# Patient Record
Sex: Female | Born: 1980 | Race: White | Hispanic: No | Marital: Single | Smoking: Current some day smoker
Health system: Southern US, Community
[De-identification: ages and names within clinical notes are randomized; demographics above are authoritative.]

## PROBLEM LIST (undated history)

## (undated) DIAGNOSIS — I219 Acute myocardial infarction, unspecified: Secondary | ICD-10-CM

## (undated) DIAGNOSIS — R1031 Right lower quadrant pain: Secondary | ICD-10-CM

## (undated) DIAGNOSIS — G8929 Other chronic pain: Secondary | ICD-10-CM

## (undated) HISTORY — PX: CHOLECYSTECTOMY: SHX55

---

## 2009-07-02 ENCOUNTER — Emergency Department (HOSPITAL_BASED_OUTPATIENT_CLINIC_OR_DEPARTMENT_OTHER): Admission: EM | Admit: 2009-07-02 | Discharge: 2009-07-02 | Payer: Self-pay | Admitting: Emergency Medicine

## 2009-07-02 ENCOUNTER — Ambulatory Visit: Payer: Self-pay | Admitting: Diagnostic Radiology

## 2009-08-18 ENCOUNTER — Ambulatory Visit: Payer: Self-pay | Admitting: Diagnostic Radiology

## 2009-08-18 ENCOUNTER — Emergency Department (HOSPITAL_BASED_OUTPATIENT_CLINIC_OR_DEPARTMENT_OTHER): Admission: EM | Admit: 2009-08-18 | Discharge: 2009-08-18 | Payer: Self-pay | Admitting: Emergency Medicine

## 2009-09-06 ENCOUNTER — Ambulatory Visit: Payer: Self-pay | Admitting: Diagnostic Radiology

## 2009-09-06 ENCOUNTER — Emergency Department (HOSPITAL_BASED_OUTPATIENT_CLINIC_OR_DEPARTMENT_OTHER): Admission: EM | Admit: 2009-09-06 | Discharge: 2009-09-06 | Payer: Self-pay | Admitting: Emergency Medicine

## 2009-11-02 ENCOUNTER — Ambulatory Visit: Payer: Self-pay | Admitting: Diagnostic Radiology

## 2009-11-02 ENCOUNTER — Emergency Department (HOSPITAL_BASED_OUTPATIENT_CLINIC_OR_DEPARTMENT_OTHER): Admission: EM | Admit: 2009-11-02 | Discharge: 2009-11-02 | Payer: Self-pay | Admitting: Emergency Medicine

## 2009-11-30 ENCOUNTER — Emergency Department (HOSPITAL_BASED_OUTPATIENT_CLINIC_OR_DEPARTMENT_OTHER): Admission: EM | Admit: 2009-11-30 | Discharge: 2009-12-01 | Payer: Self-pay | Admitting: Emergency Medicine

## 2009-11-30 ENCOUNTER — Ambulatory Visit: Payer: Self-pay | Admitting: Diagnostic Radiology

## 2009-12-10 ENCOUNTER — Emergency Department (HOSPITAL_BASED_OUTPATIENT_CLINIC_OR_DEPARTMENT_OTHER): Admission: EM | Admit: 2009-12-10 | Discharge: 2009-12-10 | Payer: Self-pay | Admitting: Emergency Medicine

## 2009-12-10 ENCOUNTER — Ambulatory Visit: Payer: Self-pay | Admitting: Diagnostic Radiology

## 2010-02-23 ENCOUNTER — Ambulatory Visit: Payer: Self-pay | Admitting: Radiology

## 2010-02-23 ENCOUNTER — Emergency Department (HOSPITAL_BASED_OUTPATIENT_CLINIC_OR_DEPARTMENT_OTHER): Admission: EM | Admit: 2010-02-23 | Discharge: 2010-02-23 | Payer: Self-pay | Admitting: Emergency Medicine

## 2010-06-06 ENCOUNTER — Emergency Department (HOSPITAL_BASED_OUTPATIENT_CLINIC_OR_DEPARTMENT_OTHER): Admission: EM | Admit: 2010-06-06 | Discharge: 2010-06-07 | Payer: Self-pay | Admitting: Emergency Medicine

## 2010-06-06 ENCOUNTER — Ambulatory Visit: Payer: Self-pay | Admitting: Diagnostic Radiology

## 2010-07-24 IMAGING — CT CT ANGIO CHEST
2 of 6 series · 18 of 36 positions shown · IV contrast (APPLIED)
Comparison: None

CLINICAL DATA: Chest pain, back pain

CT ANGIOGRAPHY CHEST WITH CONTRAST
TECHNIQUE: Multidetector CT imaging of the chest was performed
using the standard protocol during bolus administration of
intravenous contrast. Multiplanar CT image reconstructions
including MIPs were obtained to evaluate the vascular anatomy.
Contrast: 80 ml of Rmnipaque-ACC

[Series 10: pe 1.0 b25f · axial · 0.63mm/px · z∈[-270,-63]mm · 17 of 231 slices shown]
[im 12/231  lung]
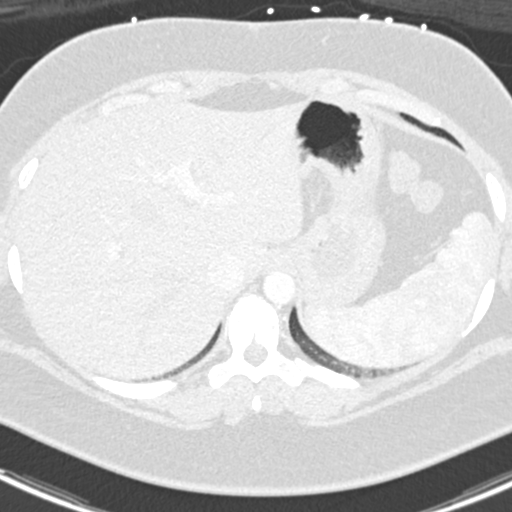
[im 24/231  mediastinal]
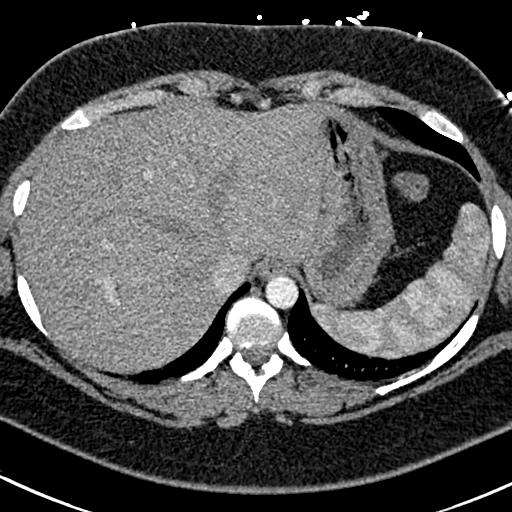
[im 35/231  lung]
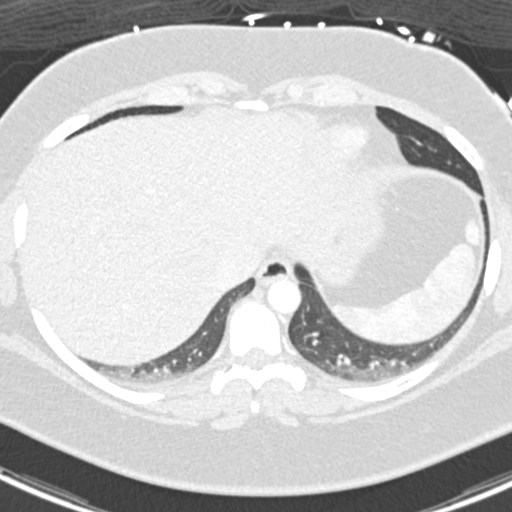
[im 47/231  mediastinal]
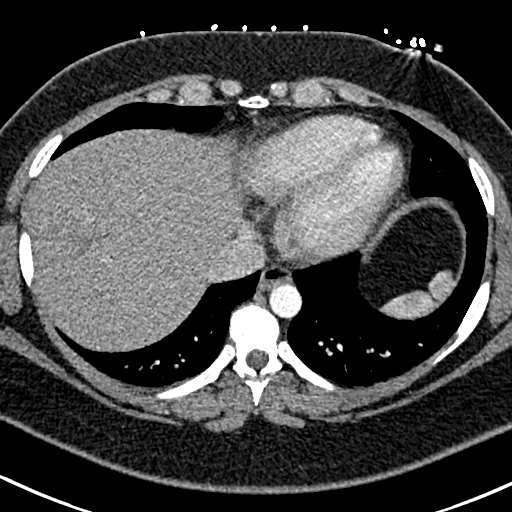
[im 70/231  lung]
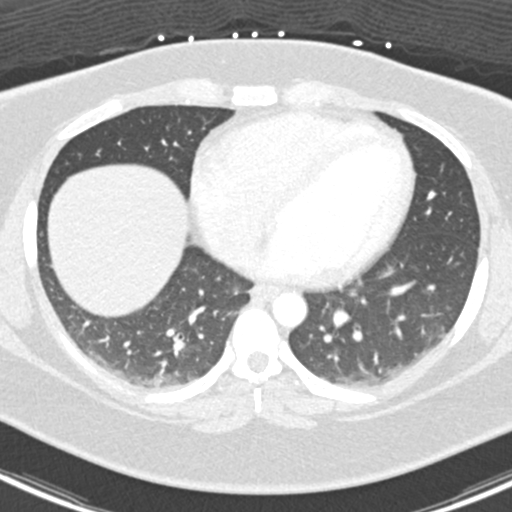
[im 81/231  mediastinal]
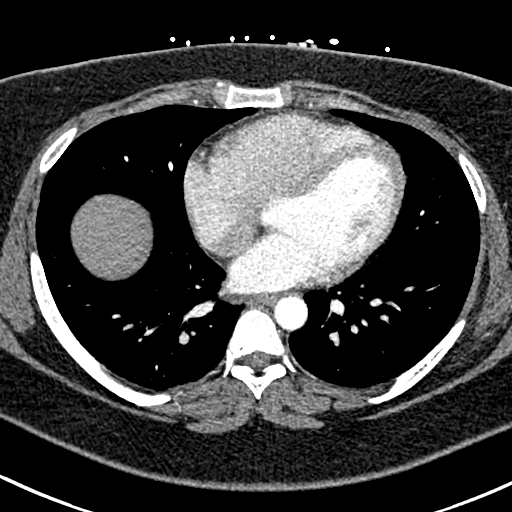
[im 93/231  lung]
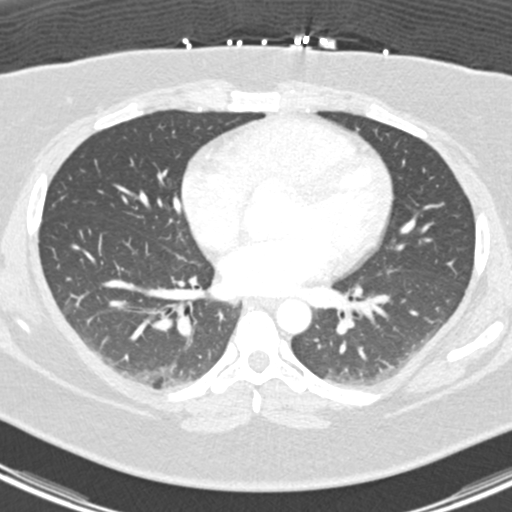
[im 104/231  mediastinal]
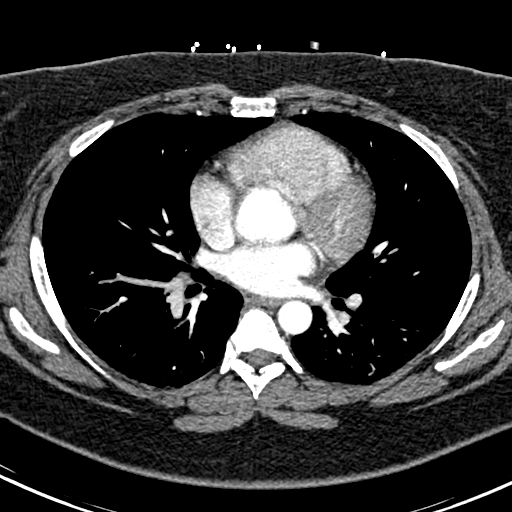
[im 116/231  lung]
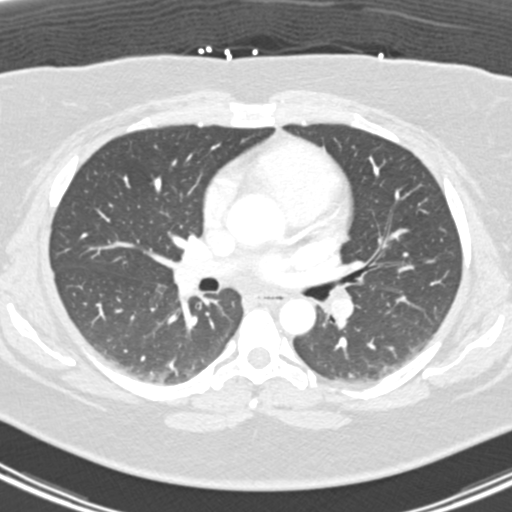
[im 127/231  mediastinal]
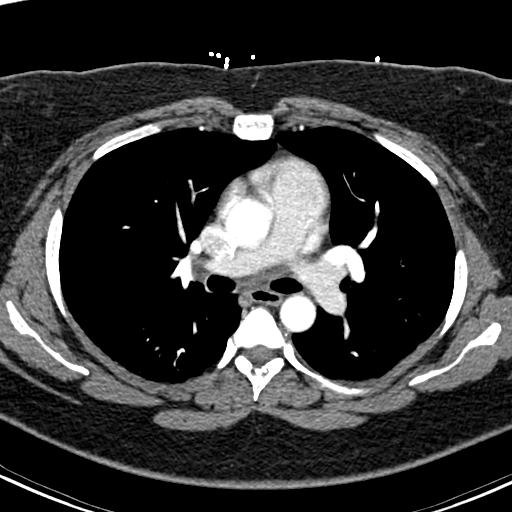
[im 139/231  lung]
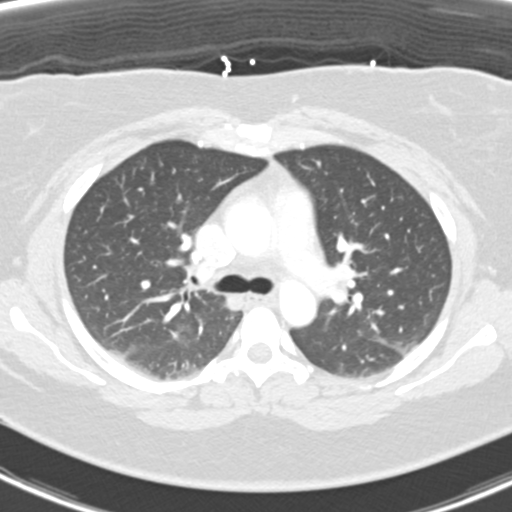
[im 150/231  mediastinal]
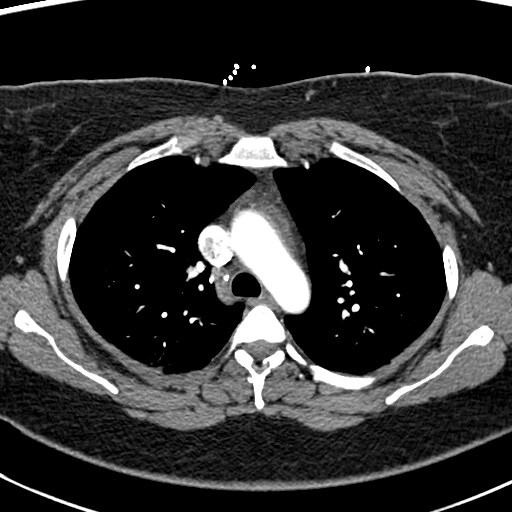
[im 162/231  lung]
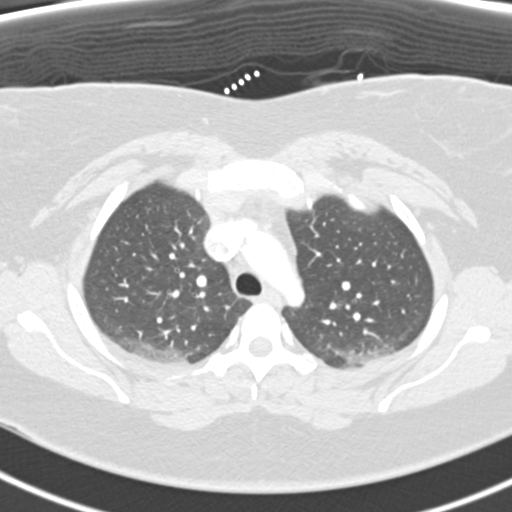
[im 185/231  mediastinal]
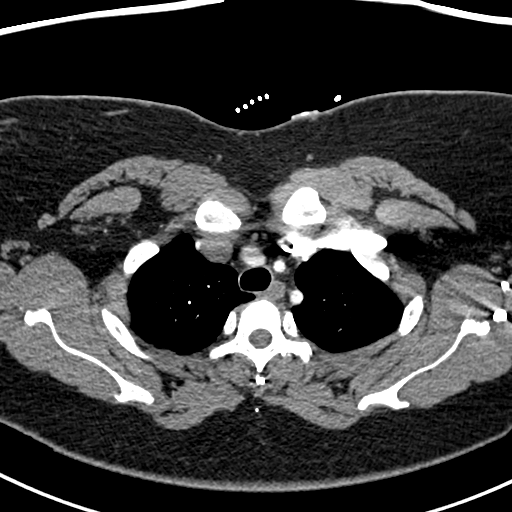
[im 196/231  lung]
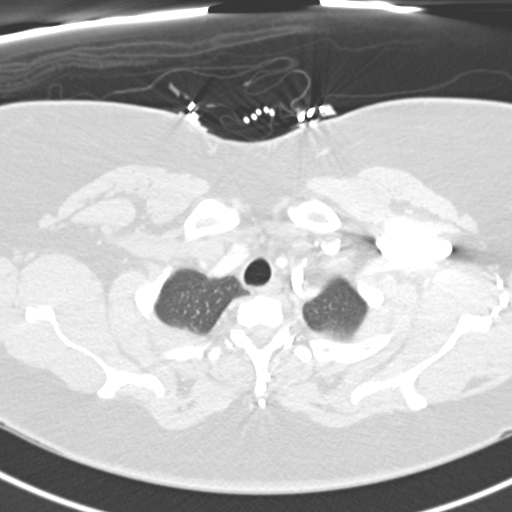
[im 208/231  mediastinal]
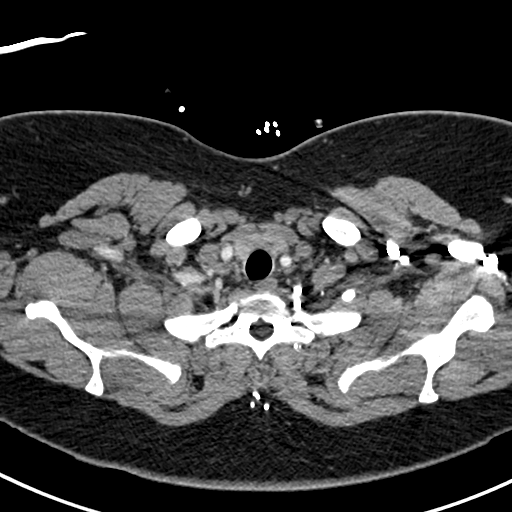
[im 219/231  lung]
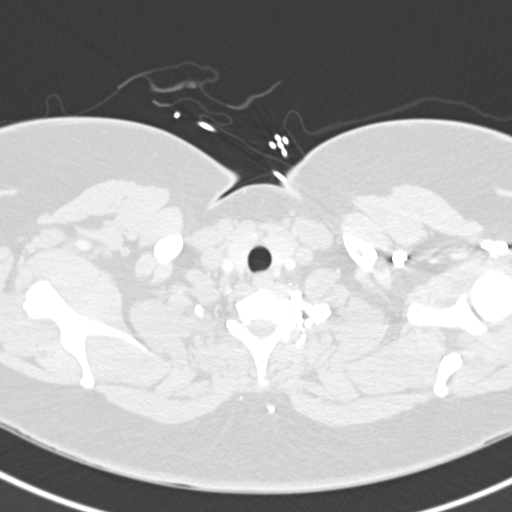

[Series 13: pe 2.0 coronal · coronal · 0.38mm/px · 1 of 115 slices shown]
[im 58/115  mediastinal]
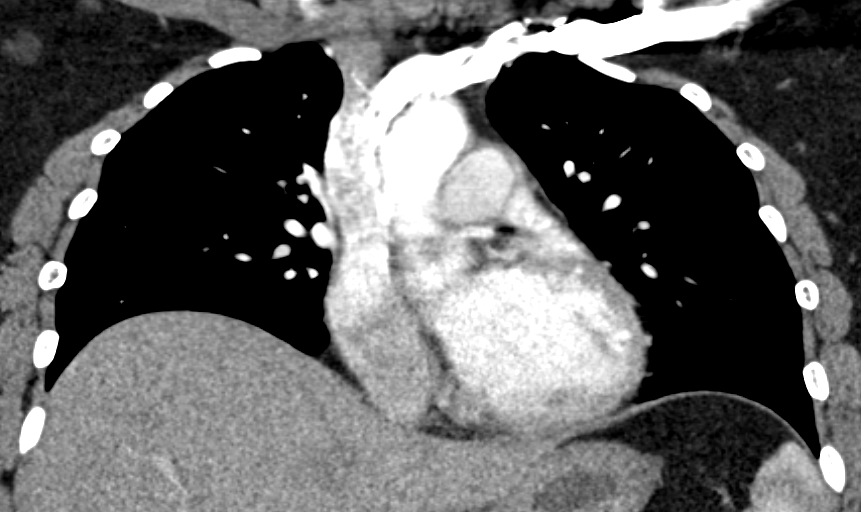

[18 of 36 positions shown; findings below may reference images not displayed]

FINDINGS: Images of the thoracic inlet are unremarkable.  Central
airways are patent.  No aortic aneurysm.  Heart size is within
normal limits.  No pericardial effusion.  No adenopathy.  The study
is limited due to poor bolus contrast opacification of the
pulmonary artery.  No gross central pulmonary embolus is noted.
Exam is limited for evaluation of the lobar branches.  The exam is
not diagnostic for evaluation of segmental arterial branches.

Images of the lung parenchyma shows no acute infiltrate or pleural
effusion.  No pulmonary edema.

No destructive bony lesions are noted.

The visualized upper abdomen is unremarkable.

Review of the MIP images confirms the above findings.
IMPRESSION: 1.  Limited exam as described above.  No gross central pulmonary
embolus is noted.  The exam is nondiagnostic for evaluation of the
segmental arterial branches.  If there is high clinical suspicion
for pulmonary embolus further evaluation with VQ scan could be
performed or a repeat CT of the chest in the 24 hours.
2.  No acute infiltrate or edema.

## 2010-10-27 IMAGING — CR DG CHEST 2V
2 series · 2 of 2 positions shown · non-contrast
Comparison: 11/30/2009

CLINICAL DATA: Chest pain radiates to the left side of back.

CHEST - 2 VIEW

[w chest pa]
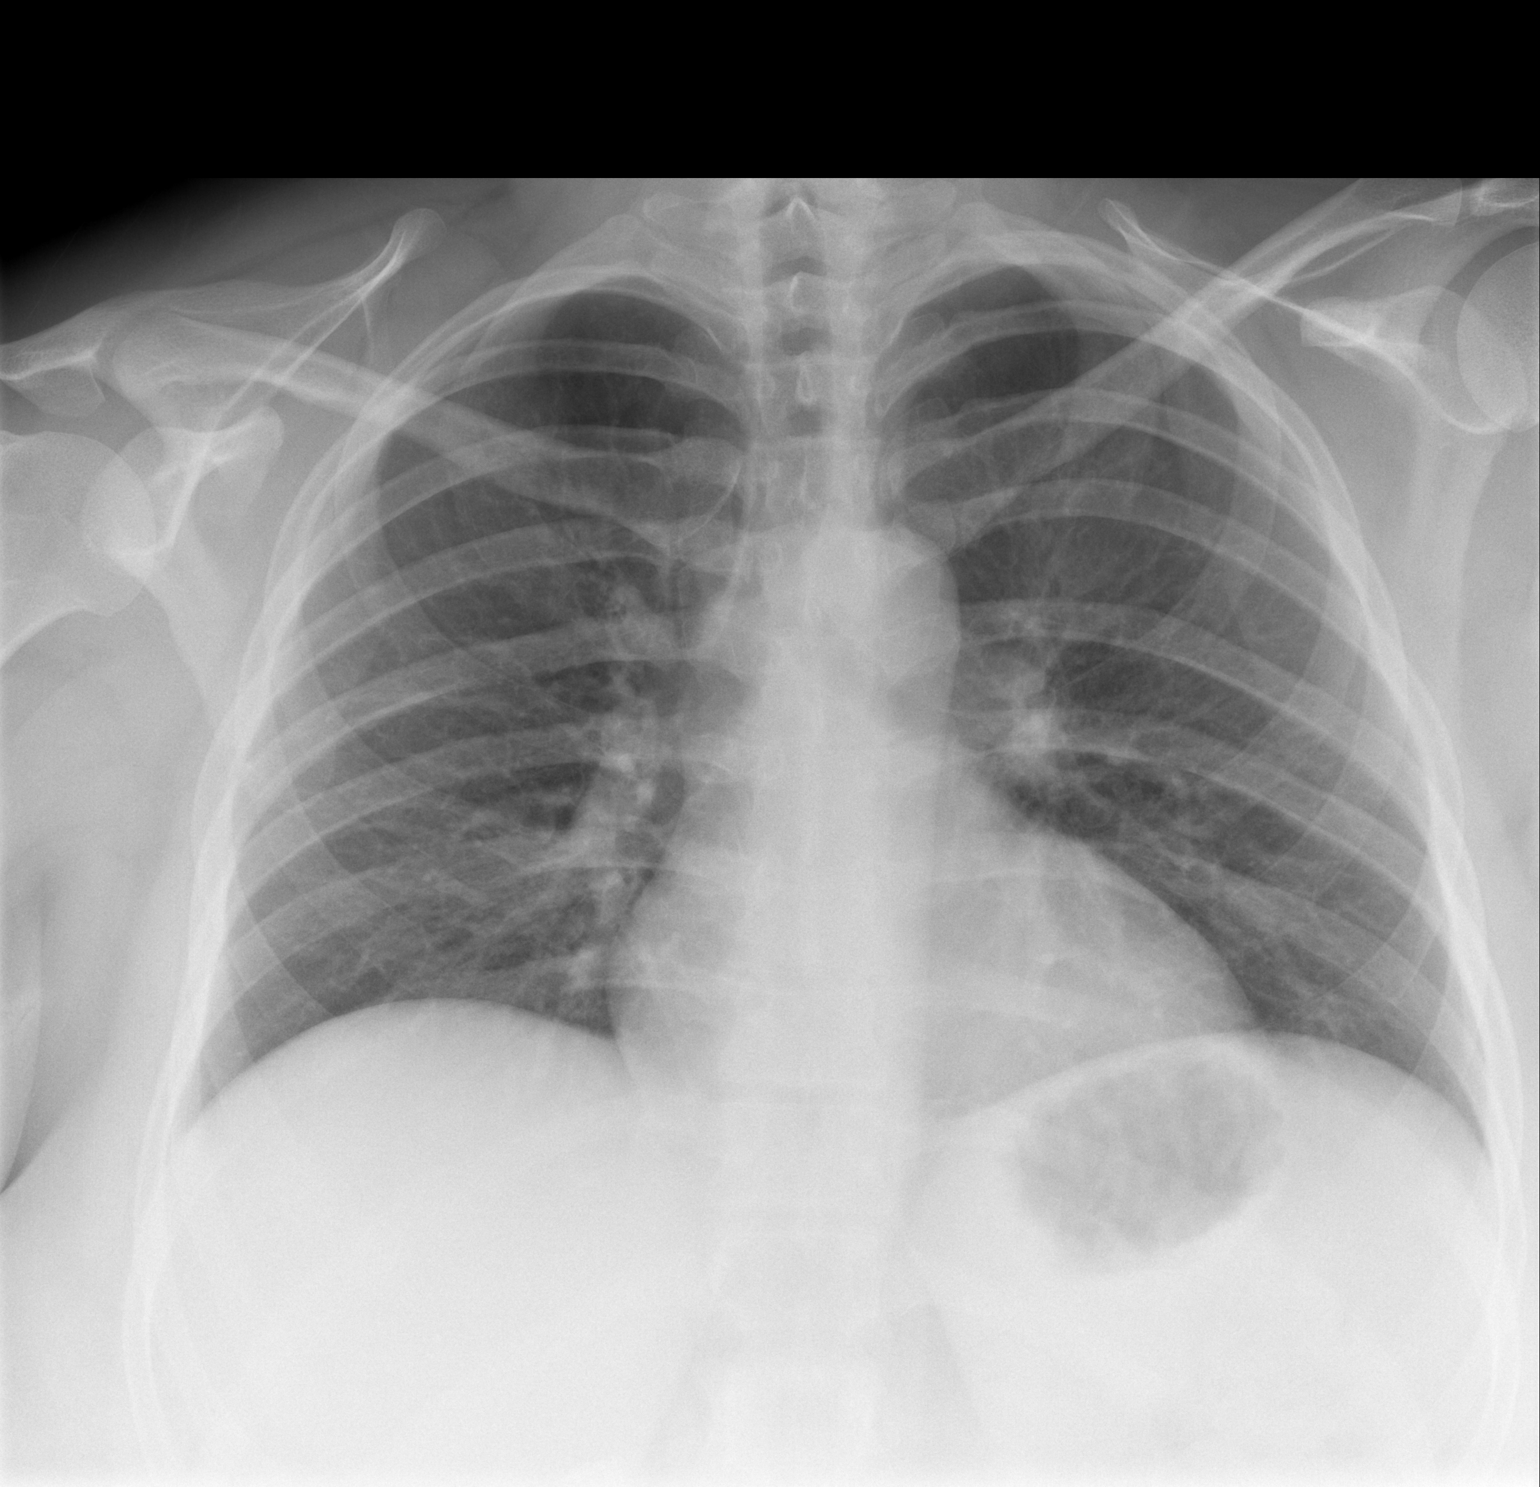

[w chest lat]
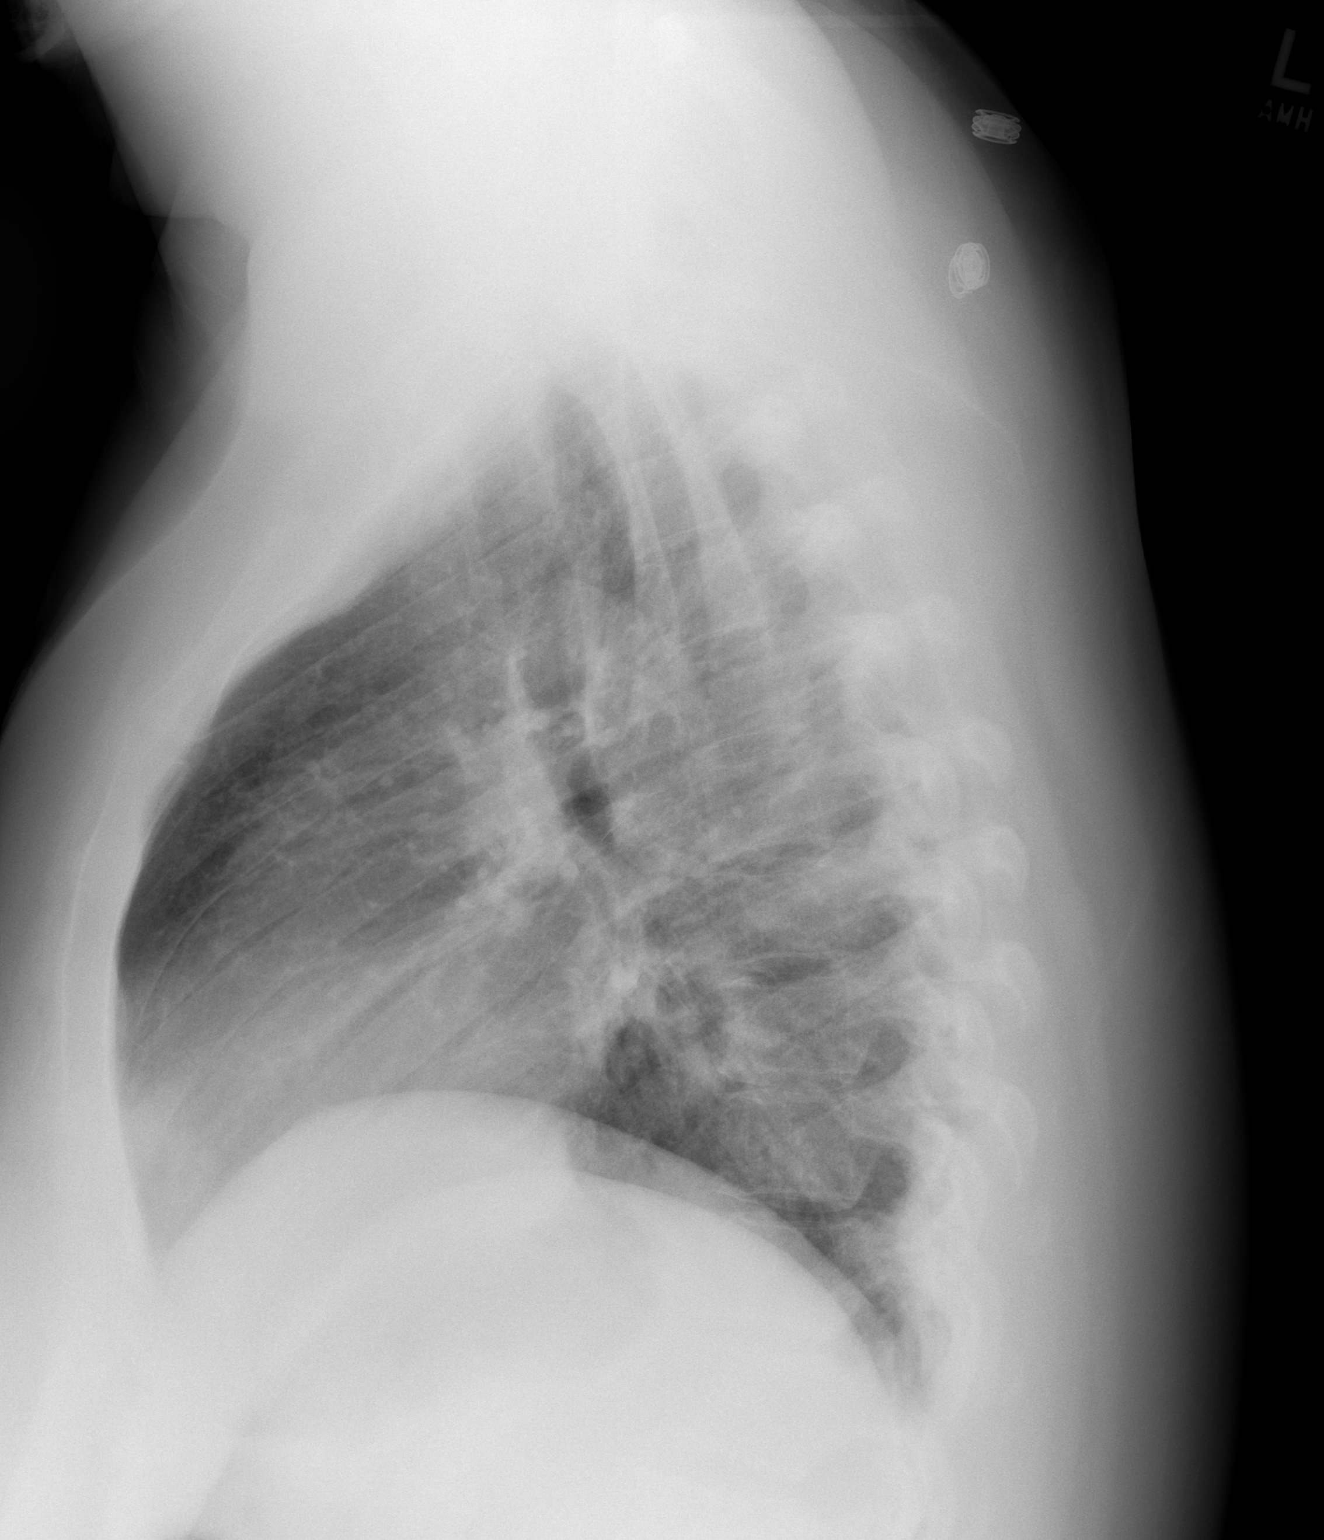

[2 of 2 positions shown; findings below may reference images not displayed]

FINDINGS: Lung volumes are low. The lungs are clear without focal
infiltrate, edema, pneumothorax or pleural effusion. The
cardiopericardial silhouette is within normal limits for size.
Imaged bony structures of the thorax are intact.
IMPRESSION: Low lung volumes.  Otherwise normal exam.

## 2010-11-09 ENCOUNTER — Emergency Department (HOSPITAL_BASED_OUTPATIENT_CLINIC_OR_DEPARTMENT_OTHER)
Admission: EM | Admit: 2010-11-09 | Discharge: 2010-11-09 | Payer: Self-pay | Source: Home / Self Care | Admitting: Emergency Medicine

## 2010-11-14 LAB — CBC
Hemoglobin: 13.8 g/dL (ref 12.0–15.0)
MCH: 30.8 pg (ref 26.0–34.0)
MCHC: 33.9 g/dL (ref 30.0–36.0)
Platelets: 239 10*3/uL (ref 150–400)
RDW: 13.2 % (ref 11.5–15.5)

## 2010-11-14 LAB — URINALYSIS, ROUTINE W REFLEX MICROSCOPIC
Ketones, ur: NEGATIVE mg/dL
Specific Gravity, Urine: 1.024 (ref 1.005–1.030)

## 2010-11-14 LAB — COMPREHENSIVE METABOLIC PANEL
ALT: 27 U/L (ref 0–35)
Albumin: 4.1 g/dL (ref 3.5–5.2)
Calcium: 9.2 mg/dL (ref 8.4–10.5)
GFR calc Af Amer: >60 mL/min (ref >60)
Glucose, Bld: 97 mg/dL (ref 70–99)
Potassium: 4.1 mEq/L (ref 3.5–5.1)
Sodium: 144 mEq/L (ref 135–145)
Total Protein: 7.3 g/dL (ref 6.0–8.3)

## 2010-11-14 LAB — POCT CARDIAC MARKERS
CKMB, poc: <1 ng/mL — ABNORMAL LOW (ref 1.0–8.0)
Myoglobin, poc: 46.9 ng/mL (ref 12–200)
Myoglobin, poc: 44.1 ng/mL (ref 12–200)
Troponin i, poc: <0.05 ng/mL (ref 0.00–0.09)

## 2010-11-14 LAB — DIFFERENTIAL
Basophils Absolute: 0 10*3/uL (ref 0.0–0.1)
Basophils Relative: 0 % (ref 0–1)
Eosinophils Absolute: 0.2 10*3/uL (ref 0.0–0.7)
Eosinophils Relative: 2 % (ref 0–5)
Lymphocytes Relative: 31 % (ref 12–46)

## 2010-11-14 LAB — LIPASE, BLOOD: Lipase: 66 U/L (ref 23–300)

## 2010-11-14 LAB — URINE CULTURE: Colony Count: 70000

## 2010-11-14 LAB — URINE MICROSCOPIC-ADD ON

## 2010-12-08 ENCOUNTER — Emergency Department (HOSPITAL_BASED_OUTPATIENT_CLINIC_OR_DEPARTMENT_OTHER)
Admission: EM | Admit: 2010-12-08 | Discharge: 2010-12-08 | Disposition: A | Discharge status: Home / Self Care | Payer: Self-pay | Attending: Emergency Medicine | Admitting: Emergency Medicine

## 2010-12-08 DIAGNOSIS — R21 Rash and other nonspecific skin eruption: Secondary | ICD-10-CM | POA: Insufficient documentation

## 2010-12-08 DIAGNOSIS — M79609 Pain in unspecified limb: Secondary | ICD-10-CM | POA: Insufficient documentation

## 2011-01-06 LAB — URINALYSIS, ROUTINE W REFLEX MICROSCOPIC
Bilirubin Urine: NEGATIVE
Hgb urine dipstick: NEGATIVE
Protein, ur: NEGATIVE mg/dL
Specific Gravity, Urine: 1.025 (ref 1.005–1.030)
Urobilinogen, UA: 0.2 mg/dL (ref 0.0–1.0)

## 2011-01-06 LAB — URINE MICROSCOPIC-ADD ON

## 2011-01-10 LAB — URINALYSIS, ROUTINE W REFLEX MICROSCOPIC
Ketones, ur: NEGATIVE mg/dL
Nitrite: NEGATIVE
Specific Gravity, Urine: 1.022 (ref 1.005–1.030)
Urobilinogen, UA: 0.2 mg/dL (ref 0.0–1.0)
pH: 5.5 (ref 5.0–8.0)

## 2011-01-10 LAB — PREGNANCY, URINE: Preg Test, Ur: NEGATIVE

## 2011-01-10 LAB — URINE MICROSCOPIC-ADD ON

## 2011-01-11 ENCOUNTER — Emergency Department (HOSPITAL_BASED_OUTPATIENT_CLINIC_OR_DEPARTMENT_OTHER)
Admission: EM | Admit: 2011-01-11 | Discharge: 2011-01-12 | Disposition: A | Discharge status: Home / Self Care | Payer: Self-pay | Attending: Emergency Medicine | Admitting: Emergency Medicine

## 2011-01-11 DIAGNOSIS — J029 Acute pharyngitis, unspecified: Secondary | ICD-10-CM | POA: Insufficient documentation

## 2011-01-11 DIAGNOSIS — J069 Acute upper respiratory infection, unspecified: Secondary | ICD-10-CM | POA: Insufficient documentation

## 2011-01-11 DIAGNOSIS — R0789 Other chest pain: Secondary | ICD-10-CM | POA: Insufficient documentation

## 2011-01-11 DIAGNOSIS — R071 Chest pain on breathing: Secondary | ICD-10-CM | POA: Insufficient documentation

## 2011-01-11 LAB — URINALYSIS, ROUTINE W REFLEX MICROSCOPIC
Bilirubin Urine: NEGATIVE
Glucose, UA: NEGATIVE mg/dL
Hgb urine dipstick: NEGATIVE
Ketones, ur: NEGATIVE mg/dL
Nitrite: NEGATIVE
Protein, ur: NEGATIVE mg/dL
Specific Gravity, Urine: 1.018 (ref 1.005–1.030)
Urobilinogen, UA: 0.2 mg/dL (ref 0.0–1.0)
pH: 7.5 (ref 5.0–8.0)

## 2011-01-11 LAB — POCT CARDIAC MARKERS
CKMB, poc: <1 ng/mL — ABNORMAL LOW (ref 1.0–8.0)
Myoglobin, poc: 56 ng/mL (ref 12–200)

## 2011-01-11 LAB — CBC
HCT: 41.3 % (ref 36.0–46.0)
Hemoglobin: 14.1 g/dL (ref 12.0–15.0)
Hemoglobin: 14.1 g/dL (ref 12.0–15.0)
MCHC: 34.2 g/dL (ref 30.0–36.0)
MCV: 94.5 fL (ref 78.0–100.0)
MCV: 95 fL (ref 78.0–100.0)
Platelets: 205 10*3/uL (ref 150–400)
RBC: 4.35 MIL/uL (ref 3.87–5.11)
RBC: 4.4 MIL/uL (ref 3.87–5.11)
RDW: 12.8 % (ref 11.5–15.5)
WBC: 12.8 10*3/uL — ABNORMAL HIGH (ref 4.0–10.5)
WBC: 9.3 10*3/uL (ref 4.0–10.5)

## 2011-01-11 LAB — DIFFERENTIAL
Basophils Absolute: 0.1 10*3/uL (ref 0.0–0.1)
Basophils Relative: 1 % (ref 0–1)
Eosinophils Absolute: 0.5 10*3/uL (ref 0.0–0.7)
Eosinophils Relative: 4 % (ref 0–5)
Lymphocytes Relative: 20 % (ref 12–46)
Lymphs Abs: 2.6 10*3/uL (ref 0.7–4.0)
Lymphs Abs: 2.6 K/uL (ref 0.7–4.0)
Monocytes Absolute: 0.4 10*3/uL (ref 0.1–1.0)
Monocytes Absolute: 0.7 10*3/uL (ref 0.1–1.0)
Monocytes Relative: 4 % (ref 3–12)
Monocytes Relative: 5 % (ref 3–12)
Neutro Abs: 6.2 10*3/uL (ref 1.7–7.7)
Neutro Abs: 8.9 K/uL — ABNORMAL HIGH (ref 1.7–7.7)
Neutrophils Relative %: 66 % (ref 43–77)
Neutrophils Relative %: 70 % (ref 43–77)

## 2011-01-11 LAB — LIPASE, BLOOD: Lipase: 97 U/L (ref 23–300)

## 2011-01-11 LAB — URINE MICROSCOPIC-ADD ON

## 2011-01-11 LAB — URINE CULTURE: Colony Count: 4000

## 2011-01-11 LAB — COMPREHENSIVE METABOLIC PANEL
ALT: 12 U/L (ref 0–35)
AST: 16 U/L (ref 0–37)
Albumin: 4.3 g/dL (ref 3.5–5.2)
Alkaline Phosphatase: 81 U/L (ref 39–117)
CO2: 29 mEq/L (ref 19–32)
Chloride: 105 mEq/L (ref 96–112)
GFR calc Af Amer: >60 mL/min (ref >60)
GFR calc non Af Amer: >60 mL/min (ref >60)
Potassium: 4.2 mEq/L (ref 3.5–5.1)
Sodium: 143 mEq/L (ref 135–145)
Total Bilirubin: 0.3 mg/dL (ref 0.3–1.2)

## 2011-01-11 LAB — BASIC METABOLIC PANEL
Calcium: 9.1 mg/dL (ref 8.4–10.5)
Chloride: 107 mEq/L (ref 96–112)
Creatinine, Ser: 0.9 mg/dL (ref 0.4–1.2)
GFR calc Af Amer: >60 mL/min (ref >60)
Sodium: 143 mEq/L (ref 135–145)

## 2011-01-11 LAB — COMPREHENSIVE METABOLIC PANEL WITH GFR
BUN: 12 mg/dL (ref 6–23)
Calcium: 9.3 mg/dL (ref 8.4–10.5)
Creatinine, Ser: 0.8 mg/dL (ref 0.4–1.2)
Glucose, Bld: 91 mg/dL (ref 70–99)
Total Protein: 7.6 g/dL (ref 6.0–8.3)

## 2011-01-11 LAB — PREGNANCY, URINE: Preg Test, Ur: NEGATIVE

## 2011-01-25 LAB — COMPREHENSIVE METABOLIC PANEL
ALT: 20 U/L (ref 0–35)
AST: 19 U/L (ref 0–37)
Alkaline Phosphatase: 83 U/L (ref 39–117)
CO2: 26 mEq/L (ref 19–32)
Chloride: 107 mEq/L (ref 96–112)
GFR calc Af Amer: >60 mL/min (ref >60)
GFR calc non Af Amer: >60 mL/min (ref >60)
Potassium: 4 mEq/L (ref 3.5–5.1)
Sodium: 143 mEq/L (ref 135–145)
Total Bilirubin: 0.4 mg/dL (ref 0.3–1.2)

## 2011-01-25 LAB — DIFFERENTIAL
Basophils Absolute: 0 10*3/uL (ref 0.0–0.1)
Basophils Relative: 0 % (ref 0–1)
Eosinophils Absolute: 0.2 10*3/uL (ref 0.0–0.7)
Eosinophils Relative: 3 % (ref 0–5)
Lymphs Abs: 1.9 10*3/uL (ref 0.7–4.0)

## 2011-01-25 LAB — CBC
RBC: 4.3 MIL/uL (ref 3.87–5.11)
WBC: 7.1 10*3/uL (ref 4.0–10.5)

## 2011-01-25 LAB — POCT CARDIAC MARKERS: CKMB, poc: <1 ng/mL — ABNORMAL LOW (ref 1.0–8.0)

## 2011-01-25 LAB — URINALYSIS, ROUTINE W REFLEX MICROSCOPIC
Glucose, UA: NEGATIVE mg/dL
Protein, ur: NEGATIVE mg/dL

## 2011-01-25 LAB — URINE MICROSCOPIC-ADD ON

## 2011-01-25 LAB — D-DIMER, QUANTITATIVE: D-Dimer, Quant: 0.74 ug/mL-FEU — ABNORMAL HIGH (ref 0.00–0.48)

## 2011-01-26 LAB — POCT CARDIAC MARKERS
CKMB, poc: <1 ng/mL — ABNORMAL LOW (ref 1.0–8.0)
Myoglobin, poc: 61.5 ng/mL (ref 12–200)
Troponin i, poc: <0.05 ng/mL (ref 0.00–0.09)
Troponin i, poc: <0.05 ng/mL (ref 0.00–0.09)

## 2011-01-26 LAB — COMPREHENSIVE METABOLIC PANEL
ALT: 27 U/L (ref 0–35)
AST: 22 U/L (ref 0–37)
Alkaline Phosphatase: 90 U/L (ref 39–117)
CO2: 28 mEq/L (ref 19–32)
GFR calc non Af Amer: >60 mL/min (ref >60)
Glucose, Bld: 105 mg/dL — ABNORMAL HIGH (ref 70–99)
Potassium: 4 mEq/L (ref 3.5–5.1)
Sodium: 142 mEq/L (ref 135–145)
Total Protein: 7.7 g/dL (ref 6.0–8.3)

## 2011-01-26 LAB — CBC
HCT: 41 % (ref 36.0–46.0)
Hemoglobin: 13.8 g/dL (ref 12.0–15.0)
MCHC: 33.8 g/dL (ref 30.0–36.0)
RDW: 13.1 % (ref 11.5–15.5)

## 2011-01-26 LAB — DIFFERENTIAL
Basophils Relative: 1 % (ref 0–1)
Eosinophils Absolute: 0.2 10*3/uL (ref 0.0–0.7)
Eosinophils Relative: 2 % (ref 0–5)
Monocytes Relative: 3 % (ref 3–12)
Neutrophils Relative %: 69 % (ref 43–77)

## 2011-01-26 LAB — AMYLASE: Amylase: 32 U/L (ref 27–131)

## 2011-02-02 ENCOUNTER — Emergency Department (HOSPITAL_COMMUNITY)
Admission: EM | Admit: 2011-02-02 | Discharge: 2011-02-03 | Disposition: A | Discharge status: Home / Self Care | Payer: Self-pay | Attending: Emergency Medicine | Admitting: Emergency Medicine

## 2011-02-02 DIAGNOSIS — R112 Nausea with vomiting, unspecified: Secondary | ICD-10-CM | POA: Insufficient documentation

## 2011-02-02 DIAGNOSIS — N949 Unspecified condition associated with female genital organs and menstrual cycle: Secondary | ICD-10-CM | POA: Insufficient documentation

## 2011-02-02 DIAGNOSIS — R197 Diarrhea, unspecified: Secondary | ICD-10-CM | POA: Insufficient documentation

## 2011-02-02 DIAGNOSIS — I899 Noninfective disorder of lymphatic vessels and lymph nodes, unspecified: Secondary | ICD-10-CM | POA: Insufficient documentation

## 2011-02-02 DIAGNOSIS — R10819 Abdominal tenderness, unspecified site: Secondary | ICD-10-CM | POA: Insufficient documentation

## 2011-02-02 LAB — DIFFERENTIAL
Basophils Absolute: 0 10*3/uL (ref 0.0–0.1)
Basophils Relative: 1 % (ref 0–1)
Eosinophils Absolute: 0.3 10*3/uL (ref 0.0–0.7)
Eosinophils Relative: 3 % (ref 0–5)
Lymphocytes Relative: 31 % (ref 12–46)
Lymphs Abs: 2.7 K/uL (ref 0.7–4.0)
Monocytes Absolute: 0.6 10*3/uL (ref 0.1–1.0)
Monocytes Relative: 7 % (ref 3–12)
Neutro Abs: 5.1 10*3/uL (ref 1.7–7.7)
Neutrophils Relative %: 59 % (ref 43–77)

## 2011-02-02 LAB — COMPREHENSIVE METABOLIC PANEL
ALT: 18 U/L (ref 0–35)
AST: 22 U/L (ref 0–37)
CO2: 25 mEq/L (ref 19–32)
Calcium: 9.2 mg/dL (ref 8.4–10.5)
Chloride: 108 mEq/L (ref 96–112)
Creatinine, Ser: 0.8 mg/dL (ref 0.4–1.2)
GFR calc non Af Amer: >60 mL/min (ref >60)
Glucose, Bld: 89 mg/dL (ref 70–99)
Total Bilirubin: 0.3 mg/dL (ref 0.3–1.2)

## 2011-02-02 LAB — URINALYSIS, ROUTINE W REFLEX MICROSCOPIC
Bilirubin Urine: NEGATIVE
Glucose, UA: NEGATIVE mg/dL
Hgb urine dipstick: NEGATIVE
Ketones, ur: NEGATIVE mg/dL
Nitrite: NEGATIVE
Protein, ur: NEGATIVE mg/dL
Specific Gravity, Urine: 1.015 (ref 1.005–1.030)
Urobilinogen, UA: 0.2 mg/dL (ref 0.0–1.0)
pH: 7 (ref 5.0–8.0)

## 2011-02-02 LAB — COMPREHENSIVE METABOLIC PANEL WITH GFR
Albumin: 3.5 g/dL (ref 3.5–5.2)
Alkaline Phosphatase: 73 U/L (ref 39–117)
BUN: 7 mg/dL (ref 6–23)
GFR calc Af Amer: >60 mL/min (ref >60)
Potassium: 4 meq/L (ref 3.5–5.1)
Sodium: 139 meq/L (ref 135–145)
Total Protein: 6.5 g/dL (ref 6.0–8.3)

## 2011-02-02 LAB — URINE MICROSCOPIC-ADD ON

## 2011-02-02 LAB — CBC
HCT: 40.2 % (ref 36.0–46.0)
Hemoglobin: 13.3 g/dL (ref 12.0–15.0)
MCH: 31.1 pg (ref 26.0–34.0)
MCHC: 33.1 g/dL (ref 30.0–36.0)
MCV: 93.9 fL (ref 78.0–100.0)
Platelets: 210 10*3/uL (ref 150–400)
RBC: 4.28 MIL/uL (ref 3.87–5.11)
RDW: 13.2 % (ref 11.5–15.5)
WBC: 8.7 K/uL (ref 4.0–10.5)

## 2011-02-02 LAB — WET PREP, GENITAL
Trich, Wet Prep: NONE SEEN
Yeast Wet Prep HPF POC: NONE SEEN

## 2011-02-02 LAB — POCT PREGNANCY, URINE: Preg Test, Ur: NEGATIVE

## 2011-02-03 ENCOUNTER — Emergency Department (HOSPITAL_COMMUNITY): Payer: Self-pay

## 2011-02-04 LAB — URINE CULTURE
Colony Count: NO GROWTH
Culture  Setup Time: 201204130200
Culture: NO GROWTH

## 2011-02-07 ENCOUNTER — Emergency Department (INDEPENDENT_AMBULATORY_CARE_PROVIDER_SITE_OTHER): Payer: Self-pay

## 2011-02-07 ENCOUNTER — Emergency Department (HOSPITAL_BASED_OUTPATIENT_CLINIC_OR_DEPARTMENT_OTHER): Payer: Self-pay

## 2011-02-07 ENCOUNTER — Emergency Department (HOSPITAL_BASED_OUTPATIENT_CLINIC_OR_DEPARTMENT_OTHER)
Admission: EM | Admit: 2011-02-07 | Discharge: 2011-02-08 | Disposition: A | Discharge status: Home / Self Care | Payer: Self-pay | Attending: Emergency Medicine | Admitting: Emergency Medicine

## 2011-02-07 DIAGNOSIS — R197 Diarrhea, unspecified: Secondary | ICD-10-CM | POA: Insufficient documentation

## 2011-02-07 DIAGNOSIS — R109 Unspecified abdominal pain: Secondary | ICD-10-CM

## 2011-02-07 DIAGNOSIS — Z9089 Acquired absence of other organs: Secondary | ICD-10-CM

## 2011-02-07 DIAGNOSIS — R112 Nausea with vomiting, unspecified: Secondary | ICD-10-CM | POA: Insufficient documentation

## 2011-02-07 DIAGNOSIS — F17200 Nicotine dependence, unspecified, uncomplicated: Secondary | ICD-10-CM | POA: Insufficient documentation

## 2011-02-07 LAB — URINALYSIS, ROUTINE W REFLEX MICROSCOPIC
Glucose, UA: NEGATIVE mg/dL
Ketones, ur: NEGATIVE mg/dL
Nitrite: NEGATIVE
Protein, ur: NEGATIVE mg/dL

## 2011-02-07 LAB — COMPREHENSIVE METABOLIC PANEL
Albumin: 4 g/dL (ref 3.5–5.2)
BUN: 10 mg/dL (ref 6–23)
Calcium: 9.2 mg/dL (ref 8.4–10.5)
Chloride: 105 mEq/L (ref 96–112)
Creatinine, Ser: 0.8 mg/dL (ref 0.4–1.2)
GFR calc Af Amer: >60 mL/min (ref >60)
Total Bilirubin: 0.6 mg/dL (ref 0.3–1.2)
Total Protein: 7.4 g/dL (ref 6.0–8.3)

## 2011-02-07 LAB — DIFFERENTIAL
Basophils Relative: 0 % (ref 0–1)
Eosinophils Absolute: 0.1 10*3/uL (ref 0.0–0.7)
Eosinophils Relative: 1 % (ref 0–5)
Lymphs Abs: 2.6 10*3/uL (ref 0.7–4.0)
Monocytes Absolute: 0.7 10*3/uL (ref 0.1–1.0)
Monocytes Relative: 7 % (ref 3–12)

## 2011-02-07 LAB — CBC
MCH: 31.1 pg (ref 26.0–34.0)
MCHC: 34 g/dL (ref 30.0–36.0)
MCV: 91.5 fL (ref 78.0–100.0)
Platelets: 224 10*3/uL (ref 150–400)
RDW: 12.6 % (ref 11.5–15.5)

## 2011-02-07 LAB — URINE MICROSCOPIC-ADD ON

## 2011-02-07 MED ORDER — IOHEXOL 300 MG/ML  SOLN
100.0000 mL | Freq: Once | INTRAMUSCULAR | Status: AC | PRN
  Administered 2011-02-07: 100 mL via INTRAVENOUS

## 2011-03-01 ENCOUNTER — Emergency Department (HOSPITAL_COMMUNITY)
Admission: EM | Admit: 2011-03-01 | Discharge: 2011-03-01 | Discharge status: Against Medical Advice | Payer: Self-pay | Attending: Emergency Medicine | Admitting: Emergency Medicine

## 2011-03-01 DIAGNOSIS — R079 Chest pain, unspecified: Secondary | ICD-10-CM | POA: Insufficient documentation

## 2011-03-14 ENCOUNTER — Emergency Department (INDEPENDENT_AMBULATORY_CARE_PROVIDER_SITE_OTHER): Payer: Self-pay

## 2011-03-14 ENCOUNTER — Emergency Department (HOSPITAL_BASED_OUTPATIENT_CLINIC_OR_DEPARTMENT_OTHER)
Admission: EM | Admit: 2011-03-14 | Discharge: 2011-03-14 | Disposition: A | Discharge status: Home / Self Care | Payer: Self-pay | Attending: Emergency Medicine | Admitting: Emergency Medicine

## 2011-03-14 DIAGNOSIS — I252 Old myocardial infarction: Secondary | ICD-10-CM | POA: Insufficient documentation

## 2011-03-14 DIAGNOSIS — R1032 Left lower quadrant pain: Secondary | ICD-10-CM

## 2011-03-14 DIAGNOSIS — F17200 Nicotine dependence, unspecified, uncomplicated: Secondary | ICD-10-CM | POA: Insufficient documentation

## 2011-03-14 LAB — BASIC METABOLIC PANEL
BUN: 11 mg/dL (ref 6–23)
Creatinine, Ser: 0.7 mg/dL (ref 0.4–1.2)
GFR calc non Af Amer: >60 mL/min (ref >60)
Glucose, Bld: 109 mg/dL — ABNORMAL HIGH (ref 70–99)
Potassium: 3.6 mEq/L (ref 3.5–5.1)

## 2011-03-14 LAB — CBC
HCT: 39.1 % (ref 36.0–46.0)
MCH: 31.3 pg (ref 26.0–34.0)
MCHC: 34.3 g/dL (ref 30.0–36.0)
MCV: 91.4 fL (ref 78.0–100.0)
Platelets: 221 10*3/uL (ref 150–400)
RDW: 12.6 % (ref 11.5–15.5)

## 2011-03-14 LAB — DIFFERENTIAL
Eosinophils Absolute: 0.1 10*3/uL (ref 0.0–0.7)
Eosinophils Relative: 1 % (ref 0–5)
Lymphocytes Relative: 26 % (ref 12–46)
Lymphs Abs: 2.9 10*3/uL (ref 0.7–4.0)
Monocytes Absolute: 0.7 10*3/uL (ref 0.1–1.0)
Monocytes Relative: 6 % (ref 3–12)

## 2011-03-14 LAB — URINALYSIS, ROUTINE W REFLEX MICROSCOPIC
Glucose, UA: NEGATIVE mg/dL
Ketones, ur: NEGATIVE mg/dL
Nitrite: NEGATIVE
Protein, ur: NEGATIVE mg/dL

## 2011-03-14 LAB — URINE MICROSCOPIC-ADD ON

## 2011-03-14 LAB — PREGNANCY, URINE: Preg Test, Ur: NEGATIVE

## 2011-03-14 MED ORDER — IOHEXOL 300 MG/ML  SOLN
100.0000 mL | Freq: Once | INTRAMUSCULAR | Status: AC | PRN
  Administered 2011-03-14: 100 mL via INTRAVENOUS

## 2011-03-15 LAB — URINE CULTURE

## 2011-03-23 ENCOUNTER — Emergency Department (HOSPITAL_BASED_OUTPATIENT_CLINIC_OR_DEPARTMENT_OTHER)
Admission: EM | Admit: 2011-03-23 | Discharge: 2011-03-23 | Disposition: A | Discharge status: Home / Self Care | Payer: Self-pay | Attending: Emergency Medicine | Admitting: Emergency Medicine

## 2011-03-23 DIAGNOSIS — I252 Old myocardial infarction: Secondary | ICD-10-CM | POA: Insufficient documentation

## 2011-03-23 DIAGNOSIS — R079 Chest pain, unspecified: Secondary | ICD-10-CM | POA: Insufficient documentation

## 2011-03-23 DIAGNOSIS — F17200 Nicotine dependence, unspecified, uncomplicated: Secondary | ICD-10-CM | POA: Insufficient documentation

## 2011-03-23 LAB — CBC
HCT: 39.1 % (ref 36.0–46.0)
Hemoglobin: 13.3 g/dL (ref 12.0–15.0)
MCH: 31.2 pg (ref 26.0–34.0)
RBC: 4.26 MIL/uL (ref 3.87–5.11)

## 2011-03-23 LAB — CK TOTAL AND CKMB (NOT AT ARMC)
CK, MB: 1 ng/mL (ref 0.3–4.0)
Total CK: 83 U/L (ref 7–177)

## 2011-03-23 LAB — BASIC METABOLIC PANEL
CO2: 22 mEq/L (ref 19–32)
Chloride: 105 mEq/L (ref 96–112)
GFR calc Af Amer: >60 mL/min (ref >60)
Sodium: 138 mEq/L (ref 135–145)

## 2011-03-23 LAB — DIFFERENTIAL
Basophils Absolute: 0 10*3/uL (ref 0.0–0.1)
Basophils Relative: 0 % (ref 0–1)
Lymphocytes Relative: 30 % (ref 12–46)
Monocytes Relative: 6 % (ref 3–12)
Neutro Abs: 4.1 10*3/uL (ref 1.7–7.7)
Neutrophils Relative %: 62 % (ref 43–77)

## 2011-12-17 ENCOUNTER — Encounter (HOSPITAL_BASED_OUTPATIENT_CLINIC_OR_DEPARTMENT_OTHER): Payer: Self-pay | Admitting: Emergency Medicine

## 2011-12-17 ENCOUNTER — Emergency Department (HOSPITAL_BASED_OUTPATIENT_CLINIC_OR_DEPARTMENT_OTHER)
Admission: EM | Admit: 2011-12-17 | Discharge: 2011-12-17 | Disposition: A | Discharge status: Home / Self Care | Payer: Medicaid Other | Attending: Emergency Medicine | Admitting: Emergency Medicine

## 2011-12-17 ENCOUNTER — Emergency Department (INDEPENDENT_AMBULATORY_CARE_PROVIDER_SITE_OTHER): Payer: Medicaid Other

## 2011-12-17 DIAGNOSIS — R112 Nausea with vomiting, unspecified: Secondary | ICD-10-CM

## 2011-12-17 DIAGNOSIS — M549 Dorsalgia, unspecified: Secondary | ICD-10-CM | POA: Insufficient documentation

## 2011-12-17 DIAGNOSIS — N390 Urinary tract infection, site not specified: Secondary | ICD-10-CM | POA: Insufficient documentation

## 2011-12-17 DIAGNOSIS — R1032 Left lower quadrant pain: Secondary | ICD-10-CM

## 2011-12-17 DIAGNOSIS — I252 Old myocardial infarction: Secondary | ICD-10-CM | POA: Insufficient documentation

## 2011-12-17 DIAGNOSIS — K439 Ventral hernia without obstruction or gangrene: Secondary | ICD-10-CM

## 2011-12-17 HISTORY — DX: Acute myocardial infarction, unspecified: I21.9

## 2011-12-17 LAB — DIFFERENTIAL
Basophils Absolute: 0 10*3/uL (ref 0.0–0.1)
Basophils Relative: 1 % (ref 0–1)
Eosinophils Absolute: 0.3 10*3/uL (ref 0.0–0.7)
Lymphs Abs: 2.3 10*3/uL (ref 0.7–4.0)
Neutrophils Relative %: 62 % (ref 43–77)

## 2011-12-17 LAB — CBC
MCH: 31.9 pg (ref 26.0–34.0)
Platelets: 191 10*3/uL (ref 150–400)
RBC: 4.27 MIL/uL (ref 3.87–5.11)
WBC: 8.5 10*3/uL (ref 4.0–10.5)

## 2011-12-17 LAB — URINALYSIS, ROUTINE W REFLEX MICROSCOPIC
Nitrite: NEGATIVE
Protein, ur: NEGATIVE mg/dL
Urobilinogen, UA: 0.2 mg/dL (ref 0.0–1.0)

## 2011-12-17 LAB — PREGNANCY, URINE: Preg Test, Ur: NEGATIVE

## 2011-12-17 LAB — COMPREHENSIVE METABOLIC PANEL
AST: 18 U/L (ref 0–37)
BUN: 12 mg/dL (ref 6–23)
CO2: 24 mEq/L (ref 19–32)
Chloride: 105 mEq/L (ref 96–112)
Creatinine, Ser: 0.8 mg/dL (ref 0.50–1.10)
GFR calc non Af Amer: >90 mL/min (ref >90)
Total Bilirubin: 0.2 mg/dL — ABNORMAL LOW (ref 0.3–1.2)

## 2011-12-17 LAB — LIPASE, BLOOD: Lipase: 44 U/L (ref 11–59)

## 2011-12-17 MED ORDER — HYDROMORPHONE HCL PF 1 MG/ML IJ SOLN
1.0000 mg | Freq: Once | INTRAMUSCULAR | Status: AC
  Administered 2011-12-17: 1 mg via INTRAVENOUS
  Filled 2011-12-17: qty 1

## 2011-12-17 MED ORDER — IOHEXOL 300 MG/ML  SOLN
100.0000 mL | Freq: Once | INTRAMUSCULAR | Status: AC | PRN
  Administered 2011-12-17: 100 mL via INTRAVENOUS

## 2011-12-17 MED ORDER — PROMETHAZINE HCL 25 MG PO TABS: 25.0000 mg | ORAL_TABLET | Freq: Four times a day (QID) | ORAL | Status: DC | PRN

## 2011-12-17 MED ORDER — SULFAMETHOXAZOLE-TMP DS 800-160 MG PO TABS: 1.0000 | ORAL_TABLET | Freq: Two times a day (BID) | ORAL | Status: AC

## 2011-12-17 MED ORDER — HYDROCODONE-ACETAMINOPHEN 5-325 MG PO TABS: 1.0000 | ORAL_TABLET | Freq: Three times a day (TID) | ORAL | Status: AC | PRN

## 2011-12-17 MED ORDER — SODIUM CHLORIDE 0.9 % IV BOLUS (SEPSIS)
1000.0000 mL | Freq: Once | INTRAVENOUS | Status: AC
  Administered 2011-12-17: 1000 mL via INTRAVENOUS

## 2011-12-17 MED ORDER — ONDANSETRON HCL 4 MG/2ML IJ SOLN
4.0000 mg | Freq: Once | INTRAMUSCULAR | Status: AC
  Administered 2011-12-17: 4 mg via INTRAVENOUS
  Filled 2011-12-17: qty 2

## 2011-12-17 MED ORDER — IOHEXOL 300 MG/ML  SOLN
40.0000 mL | Freq: Once | INTRAMUSCULAR | Status: AC | PRN
  Administered 2011-12-17: 40 mL via ORAL

## 2011-12-17 MED ORDER — SULFAMETHOXAZOLE-TMP DS 800-160 MG PO TABS
1.0000 | ORAL_TABLET | Freq: Once | ORAL | Status: AC
  Administered 2011-12-17: 1 via ORAL
  Filled 2011-12-17: qty 1

## 2011-12-17 NOTE — ED Notes (Signed)
Patient transported to CT 

## 2011-12-17 NOTE — ED Provider Notes (Signed)
History   This chart was scribed for Shannon Munch, MD by Melba Coon. The patient was seen in room MH04/MH04 and the patient's care was started at 6:20PM.    CSN: [id number]  Arrival date & time 12/17/11  1721   First MD Initiated Contact with Patient 12/17/11 1802      Chief Complaint  Patient presents with  . Abdominal Pain  . Emesis  . Back Pain    HPI Shannon Sullivan is a 31 y.o. female who presents to the Emergency Department complaining of constant, radiating, moderate to severe lower left abdominal pain with associated n/v/d with an onset 2.5 weeks. Pain radiates to the back and has become progressively worse since onset to the point where pt cannot sleep. Pt has never had similar episodes of pain. Decreased appetite present. No HA, neck pain, confusion, disorientation, CP, SOB, dysuria, hematuria, vaginal bleeding or d/c, extremity edema or extremity pain. Pt has taken ASA recently (heart attack in 2006; not followed for it anymore). Pt also has a Hx of abd surgeries (cholecystectomy). Pt has a Hx of kidney stones about 5-6 yrs ago. Allergic to Naproxen. No other pertinent medical problems.  Past Medical History  Diagnosis Date  . Acute MI     d/t carbon monoxide poisoning    Past Surgical History  Procedure Date  . Cholecystectomy     No family history on file.  History  Substance Use Topics  . Smoking status: Current Some Day Smoker  . Smokeless tobacco: Not on file  . Alcohol Use: No    OB History    Grav Para Term Preterm Abortions TAB SAB Ect Mult Living                  Review of Systems 10 Systems reviewed and are negative for acute change except as noted in the HPI.  Allergies  Naproxen  Home Medications   Current Outpatient Rx  Name Route Sig Dispense Refill  . ASPIRIN 81 MG PO TABS Oral Take 81 mg by mouth daily.    Marland Kitchen DM-PHENYLEPHRINE-ACETAMINOPHEN 10-5-325 MG/15ML PO LIQD Oral Take 20 mLs by mouth every 4 (four) hours as needed. For  cold symptoms    . IBUPROFEN 200 MG PO TABS Oral Take 200 mg by mouth once as needed. For pain      BP 122/81  Pulse 73  Temp(Src) 98.7 F (37.1 C) (Oral)  Resp 20  Ht 5\' 7"  (1.702 m)  Wt 215 lb (97.523 kg)  BMI 33.67 kg/m2  SpO2 99%  LMP 12/07/2011  Physical Exam  Nursing note and vitals reviewed. Constitutional: She appears well-developed and well-nourished.       Awake, alert, nontoxic appearance.  HENT:  Head: Normocephalic and atraumatic.  Right Ear: External ear normal.  Left Ear: External ear normal.  Eyes: Conjunctivae and EOM are normal. Pupils are equal, round, and reactive to light. Right eye exhibits no discharge. Left eye exhibits no discharge.  Neck: Normal range of motion. Neck supple.  Pulmonary/Chest: Effort normal and breath sounds normal. She exhibits no tenderness.  Abdominal: Soft. Bowel sounds are normal. There is tenderness (Suprapubic). There is no rebound.  Musculoskeletal: She exhibits no tenderness.       Baseline ROM, no obvious new focal weakness.  Neurological:       Mental status and motor strength appears baseline for patient and situation.  Skin: Skin is warm. No rash noted.  Psychiatric: She has a normal mood and affect. Her behavior  is normal.    ED Course  Procedures (including critical care time)  DIAGNOSTIC STUDIES: Oxygen Saturation is 100% on room air, normal by my interpretation.    COORDINATION OF CARE:  6:35PM - EDMD gives consultation that past abd problems put ppl at higher risk for future abd pain; EDMD will order IV Zofran and pain meds  Results for orders placed during the hospital encounter of 12/17/11  URINALYSIS, ROUTINE W REFLEX MICROSCOPIC      Component Value Range   Color, Urine YELLOW  YELLOW    APPearance CLOUDY (*) CLEAR    Specific Gravity, Urine 1.026  1.005 - 1.030    pH 6.0  5.0 - 8.0    Glucose, UA NEGATIVE  NEGATIVE (mg/dL)   Hgb urine dipstick NEGATIVE  NEGATIVE    Bilirubin Urine NEGATIVE  NEGATIVE     Ketones, ur NEGATIVE  NEGATIVE (mg/dL)   Protein, ur NEGATIVE  NEGATIVE (mg/dL)   Urobilinogen, UA 0.2  0.0 - 1.0 (mg/dL)   Nitrite NEGATIVE  NEGATIVE    Leukocytes, UA LARGE (*) NEGATIVE   PREGNANCY, URINE      Component Value Range   Preg Test, Ur NEGATIVE  NEGATIVE   URINE MICROSCOPIC-ADD ON      Component Value Range   Squamous Epithelial / LPF MANY (*) RARE    WBC, UA 21-50  <3 (WBC/hpf)   RBC / HPF 0-2  <3 (RBC/hpf)   Bacteria, UA MANY (*) RARE   COMPREHENSIVE METABOLIC PANEL      Component Value Range   Sodium 139  135 - 145 (mEq/L)   Potassium 4.1  3.5 - 5.1 (mEq/L)   Chloride 105  96 - 112 (mEq/L)   CO2 24  19 - 32 (mEq/L)   Glucose, Bld 94  70 - 99 (mg/dL)   BUN 12  6 - 23 (mg/dL)   Creatinine, Ser 4.09  0.50 - 1.10 (mg/dL)   Calcium 9.3  8.4 - 81.1 (mg/dL)   Total Protein 7.1  6.0 - 8.3 (g/dL)   Albumin 3.6  3.5 - 5.2 (g/dL)   AST 18  0 - 37 (U/L)   ALT 17  0 - 35 (U/L)   Alkaline Phosphatase 86  39 - 117 (U/L)   Total Bilirubin 0.2 (*) 0.3 - 1.2 (mg/dL)   GFR calc non Af Amer >90  >90 (mL/min)   GFR calc Af Amer >90  >90 (mL/min)  CBC      Component Value Range   WBC 8.5  4.0 - 10.5 (K/uL)   RBC 4.27  3.87 - 5.11 (MIL/uL)   Hemoglobin 13.6  12.0 - 15.0 (g/dL)   HCT 91.4  78.2 - 95.6 (%)   MCV 93.4  78.0 - 100.0 (fL)   MCH 31.9  26.0 - 34.0 (pg)   MCHC 34.1  30.0 - 36.0 (g/dL)   RDW 21.3  08.6 - 57.8 (%)   Platelets 191  150 - 400 (K/uL)  DIFFERENTIAL      Component Value Range   Neutrophils Relative 62  43 - 77 (%)   Neutro Abs 5.3  1.7 - 7.7 (K/uL)   Lymphocytes Relative 27  12 - 46 (%)   Lymphs Abs 2.3  0.7 - 4.0 (K/uL)   Monocytes Relative 7  3 - 12 (%)   Monocytes Absolute 0.6  0.1 - 1.0 (K/uL)   Eosinophils Relative 4  0 - 5 (%)   Eosinophils Absolute 0.3  0.0 - 0.7 (K/uL)  Basophils Relative 1  0 - 1 (%)   Basophils Absolute 0.0  0.0 - 0.1 (K/uL)  LIPASE, BLOOD      Component Value Range   Lipase 44  11 - 59 (U/L)     Ct Abdomen  Pelvis W Contrast  12/17/2011  *RADIOLOGY REPORT*  Clinical Data: Left lower abdominal pain.  Nausea and vomiting.  CT ABDOMEN AND PELVIS WITH CONTRAST  Technique:  Multidetector CT imaging of the abdomen and pelvis was performed following the standard protocol during bolus administration of intravenous contrast.  Contrast: 40mL OMNIPAQUE IOHEXOL 300 MG/ML IV SOLN, OMNIPAQUE IOHEXOL 300 MG/ML IV SOLN  Comparison: 03/14/2011  Findings: The liver, spleen, pancreas, and adrenal glands appear unremarkable. Common bile duct measures up to 8 mm in diameter, probably physiologic response to cholecystectomy.  No significant renal abnormality is observed. A stable 6 mm left mid kidney hypodensity is probably a cyst and is stable from 2010.  A small ventral hernia measuring 2.6 cm at its neck contains adipose tissue and appears stable.  No dilated bowel noted.  Visualized portions of the appendix appear normal.  There is stable low-level stranding at the root the mesentery, technically nonspecific.  The uterus and adnexa appear unremarkable.  Trace free pelvic fluid noted.  Urinary bladder appears unremarkable.  IMPRESSION:  1.  Trace free pelvic fluid, probably physiologic. 2.  Small ventral hernia contains adipose tissue, without acute associated findings.  Original Report Authenticated By: Dellia Cloud, M.D.     No diagnosis found.    MDM  I personally performed the services described in this documentation, which was scribed in my presence. The recorded information has been reviewed and considered.  This 31 year old female presents with several weeks of abdominal pain.  On exam she is in no distress, with unremarkable vital signs.  There is mild left sided pain.  Given the patient's description of some diarrhea, concern for colitis.  The CT did not demonstrate acute findings, and the patient's labs are largely reassuring, though there is some suggestion of urinary tract infection.  The patient  does not describe frank dysuria, she will be treated for UTI, as this is the lone abnormality, that may explain her condition.  She was discharged in stable condition following significant improvement in her pain with IV analgesics.    Shannon Munch, MD 12/17/11 2314

## 2011-12-17 NOTE — ED Notes (Signed)
rx x 3 for septra, norco and phenergan given at d/c- d/c home with ride

## 2011-12-17 NOTE — ED Notes (Signed)
Pt c/o LLQ pain, now radiating to back x 1 week; also reports NVD x 2 weeks

## 2011-12-17 NOTE — Discharge Instructions (Signed)

## 2012-04-09 ENCOUNTER — Emergency Department (HOSPITAL_BASED_OUTPATIENT_CLINIC_OR_DEPARTMENT_OTHER): Payer: Medicaid Other

## 2012-04-09 ENCOUNTER — Encounter (HOSPITAL_BASED_OUTPATIENT_CLINIC_OR_DEPARTMENT_OTHER): Payer: Self-pay | Admitting: *Deleted

## 2012-04-09 ENCOUNTER — Emergency Department (HOSPITAL_BASED_OUTPATIENT_CLINIC_OR_DEPARTMENT_OTHER)
Admission: EM | Admit: 2012-04-09 | Discharge: 2012-04-09 | Disposition: A | Discharge status: Home / Self Care | Payer: Medicaid Other | Attending: Emergency Medicine | Admitting: Emergency Medicine

## 2012-04-09 DIAGNOSIS — N949 Unspecified condition associated with female genital organs and menstrual cycle: Secondary | ICD-10-CM | POA: Insufficient documentation

## 2012-04-09 DIAGNOSIS — R112 Nausea with vomiting, unspecified: Secondary | ICD-10-CM | POA: Insufficient documentation

## 2012-04-09 DIAGNOSIS — R10819 Abdominal tenderness, unspecified site: Secondary | ICD-10-CM | POA: Insufficient documentation

## 2012-04-09 DIAGNOSIS — R6883 Chills (without fever): Secondary | ICD-10-CM | POA: Insufficient documentation

## 2012-04-09 DIAGNOSIS — R109 Unspecified abdominal pain: Secondary | ICD-10-CM | POA: Insufficient documentation

## 2012-04-09 DIAGNOSIS — Z9089 Acquired absence of other organs: Secondary | ICD-10-CM | POA: Insufficient documentation

## 2012-04-09 LAB — COMPREHENSIVE METABOLIC PANEL
ALT: 30 U/L (ref 0–35)
Alkaline Phosphatase: 77 U/L (ref 39–117)
BUN: 15 mg/dL (ref 6–23)
CO2: 23 mEq/L (ref 19–32)
Calcium: 9.6 mg/dL (ref 8.4–10.5)
GFR calc Af Amer: >90 mL/min (ref >90)
GFR calc non Af Amer: >90 mL/min (ref >90)
Glucose, Bld: 102 mg/dL — ABNORMAL HIGH (ref 70–99)
Potassium: 4.3 mEq/L (ref 3.5–5.1)
Sodium: 140 mEq/L (ref 135–145)
Total Protein: 7 g/dL (ref 6.0–8.3)

## 2012-04-09 LAB — CBC
Hemoglobin: 13.7 g/dL (ref 12.0–15.0)
MCH: 32 pg (ref 26.0–34.0)
MCV: 93.7 fL (ref 78.0–100.0)
Platelets: 233 10*3/uL (ref 150–400)
RBC: 4.28 MIL/uL (ref 3.87–5.11)
WBC: 10 10*3/uL (ref 4.0–10.5)

## 2012-04-09 LAB — URINALYSIS, ROUTINE W REFLEX MICROSCOPIC
Bilirubin Urine: NEGATIVE
Ketones, ur: NEGATIVE mg/dL
Nitrite: NEGATIVE
Protein, ur: NEGATIVE mg/dL
Urobilinogen, UA: 0.2 mg/dL (ref 0.0–1.0)

## 2012-04-09 LAB — WET PREP, GENITAL: Yeast Wet Prep HPF POC: NONE SEEN

## 2012-04-09 LAB — DIFFERENTIAL
Eosinophils Absolute: 0.1 10*3/uL (ref 0.0–0.7)
Eosinophils Relative: 1 % (ref 0–5)
Lymphocytes Relative: 22 % (ref 12–46)
Lymphs Abs: 2.2 10*3/uL (ref 0.7–4.0)
Monocytes Relative: 6 % (ref 3–12)

## 2012-04-09 LAB — PREGNANCY, URINE: Preg Test, Ur: NEGATIVE

## 2012-04-09 MED ORDER — IOHEXOL 300 MG/ML  SOLN
100.0000 mL | Freq: Once | INTRAMUSCULAR | Status: AC | PRN
  Administered 2012-04-09: 100 mL via INTRAVENOUS

## 2012-04-09 MED ORDER — ONDANSETRON HCL 4 MG/2ML IJ SOLN
4.0000 mg | Freq: Once | INTRAMUSCULAR | Status: AC
  Administered 2012-04-09: 4 mg via INTRAVENOUS
  Filled 2012-04-09: qty 2

## 2012-04-09 MED ORDER — HYDROMORPHONE HCL PF 1 MG/ML IJ SOLN
1.0000 mg | Freq: Once | INTRAMUSCULAR | Status: AC
  Administered 2012-04-09: 1 mg via INTRAVENOUS
  Filled 2012-04-09: qty 1

## 2012-04-09 MED ORDER — OXYCODONE-ACETAMINOPHEN 5-325 MG PO TABS: 1.0000 | ORAL_TABLET | Freq: Four times a day (QID) | ORAL | Status: AC | PRN

## 2012-04-09 MED ORDER — PROMETHAZINE HCL 25 MG PO TABS: 25.0000 mg | ORAL_TABLET | Freq: Four times a day (QID) | ORAL | Status: DC | PRN

## 2012-04-09 MED ORDER — OXYCODONE-ACETAMINOPHEN 5-325 MG PO TABS
2.0000 | ORAL_TABLET | Freq: Once | ORAL | Status: AC
  Administered 2012-04-09: 2 via ORAL
  Filled 2012-04-09: qty 2

## 2012-04-09 MED ORDER — SODIUM CHLORIDE 0.9 % IV BOLUS (SEPSIS)
1000.0000 mL | Freq: Once | INTRAVENOUS | Status: AC
  Administered 2012-04-09: 1000 mL via INTRAVENOUS

## 2012-04-09 MED ORDER — IOHEXOL 300 MG/ML  SOLN
20.0000 mL | Freq: Once | INTRAMUSCULAR | Status: AC | PRN
  Administered 2012-04-09: 20 mL via ORAL

## 2012-04-09 NOTE — ED Provider Notes (Addendum)
History     CSN: [id number]  Arrival date & time 04/09/12  1759   First MD Initiated Contact with Patient 04/09/12 1814      Chief Complaint  Patient presents with  . Abdominal Pain    (Consider location/radiation/quality/duration/timing/severity/associated sxs/prior treatment) Patient is a 31 y.o. female presenting with abdominal pain. The history is provided by the patient.  Abdominal Pain The primary symptoms of the illness include abdominal pain, nausea and vomiting. The primary symptoms of the illness do not include fever, diarrhea, dysuria, vaginal discharge or vaginal bleeding. The current episode started yesterday. The onset of the illness was sudden. The problem has been gradually worsening.  The abdominal pain is located in the RLQ and groin. The abdominal pain does not radiate. The severity of the abdominal pain is 9/10. The abdominal pain is relieved by nothing. The abdominal pain is exacerbated by eating.  The vomiting began today. Vomiting occurs 6 to 10 times per day. The emesis contains stomach contents.  The patient states that she believes she is currently not pregnant. The patient has not had a change in bowel habit. Additional symptoms associated with the illness include chills and anorexia. Symptoms associated with the illness do not include constipation. Significant associated medical issues include gallstones. Associated medical issues comments: Status post cholecystectomy.    Past Medical History  Diagnosis Date  . Acute MI     d/t carbon monoxide poisoning    Past Surgical History  Procedure Date  . Cholecystectomy     History reviewed. No pertinent family history.  History  Substance Use Topics  . Smoking status: Current Some Day Smoker  . Smokeless tobacco: Not on file  . Alcohol Use: No    OB History    Grav Para Term Preterm Abortions TAB SAB Ect Mult Living                  Review of Systems  Constitutional: Positive for chills. Negative  for fever.  Gastrointestinal: Positive for nausea, vomiting, abdominal pain and anorexia. Negative for diarrhea and constipation.  Genitourinary: Negative for dysuria, vaginal bleeding and vaginal discharge.  All other systems reviewed and are negative.    Allergies  Naproxen  Home Medications   Current Outpatient Rx  Name Route Sig Dispense Refill  . IBUPROFEN 200 MG PO TABS Oral Take 200 mg by mouth once as needed. For pain    . PROMETHAZINE HCL 25 MG PO TABS Oral Take 25 mg by mouth every 6 (six) hours as needed. Patient used this medication for nausea.    Marland Kitchen PROMETHAZINE HCL 25 MG PO TABS Oral Take 1 tablet (25 mg total) by mouth every 6 (six) hours as needed for nausea. 10 tablet 0    BP 129/74  Pulse 88  Temp 98 F (36.7 C)  Resp 16  Ht 5\' 7"  (1.702 m)  Wt 275 lb (124.739 kg)  BMI 43.07 kg/m2  SpO2 100%  LMP 04/01/2012  Physical Exam  Nursing note and vitals reviewed. Constitutional: She is oriented to person, place, and time. She appears well-developed and well-nourished. She appears distressed.  HENT:  Head: Normocephalic and atraumatic.  Mouth/Throat: Oropharynx is clear and moist.  Eyes: Conjunctivae and EOM are normal. Pupils are equal, round, and reactive to light.  Neck: Normal range of motion. Neck supple.  Cardiovascular: Normal rate, regular rhythm and intact distal pulses.   No murmur heard. Pulmonary/Chest: Effort normal and breath sounds normal. No respiratory distress. She has no  wheezes. She has no rales.  Abdominal: Soft. Normal appearance. She exhibits no distension. There is tenderness in the right lower quadrant and suprapubic area. There is rebound and guarding. There is no CVA tenderness. No hernia.    Musculoskeletal: Normal range of motion. She exhibits no edema and no tenderness.  Neurological: She is alert and oriented to person, place, and time.  Skin: Skin is warm and dry. No rash noted. No erythema.  Psychiatric: She has a normal mood  and affect. Her behavior is normal.    ED Course  Procedures (including critical care time)  Labs Reviewed  URINALYSIS, ROUTINE W REFLEX MICROSCOPIC - Abnormal; Notable for the following:    APPearance CLOUDY (*)     Hgb urine dipstick TRACE (*)     Leukocytes, UA SMALL (*)     All other components within normal limits  URINE MICROSCOPIC-ADD ON - Abnormal; Notable for the following:    Squamous Epithelial / LPF FEW (*)     Bacteria, UA FEW (*)     All other components within normal limits  COMPREHENSIVE METABOLIC PANEL - Abnormal; Notable for the following:    Glucose, Bld 102 (*)     Total Bilirubin 0.2 (*)     All other components within normal limits  WET PREP, GENITAL - Abnormal; Notable for the following:    Clue Cells Wet Prep HPF POC MODERATE (*)     WBC, Wet Prep HPF POC FEW (*)     All other components within normal limits  PREGNANCY, URINE  CBC  DIFFERENTIAL  GC/CHLAMYDIA PROBE AMP, GENITAL   US Transvaginal Non-ob  04/09/2012  *RADIOLOGY REPORT*  Clinical Data:  Severe right-sided pelvic pain.  Nausea and vomiting.  Clinical suspicion for ovarian torsion.  TRANSABDOMINAL AND TRANSVAGINAL ULTRASOUND OF PELVIS DOPPLER ULTRASOUND OF OVARIES  Technique:  Both transabdominal and transvaginal ultrasound examinations of the pelvis were performed. Transabdominal technique was performed for global imaging of the pelvis including uterus, ovaries, adnexal regions, and pelvic cul-de-sac.  It was necessary to proceed with endovaginal exam following the transabdominal exam to visualize the endometrium and ovaries.  Color and duplex Doppler ultrasound was utilized to evaluate blood flow to the ovaries.  Comparison:  02/03/2011  Findings:  Uterus:  9.5 x 4.9 x 6.8 cm.  No fibroids or other uterine masses identified.  Endometrium:  Double layer thickness measures 4 mm transvaginally. No focal lesion visualized.  Right ovary: 4.5 x 3.6 x 3.1 cm.  Normal appearance.  Left ovary:   3.4 x 2.4 x  2.5 cm.  Normal appearance.  Pulsed Doppler evaluation demonstrates normal low-resistance arterial and venous waveforms in both ovaries.  IMPRESSION: Normal exam.  No evidence of pelvic mass or other significant abnormality.  No sonographic evidence for ovarian torsion.  Original Report Authenticated By: Danae Orleans, M.D.   US Pelvis Complete  04/09/2012  *RADIOLOGY REPORT*  Clinical Data:  Severe right-sided pelvic pain.  Nausea and vomiting.  Clinical suspicion for ovarian torsion.  TRANSABDOMINAL AND TRANSVAGINAL ULTRASOUND OF PELVIS DOPPLER ULTRASOUND OF OVARIES  Technique:  Both transabdominal and transvaginal ultrasound examinations of the pelvis were performed. Transabdominal technique was performed for global imaging of the pelvis including uterus, ovaries, adnexal regions, and pelvic cul-de-sac.  It was necessary to proceed with endovaginal exam following the transabdominal exam to visualize the endometrium and ovaries.  Color and duplex Doppler ultrasound was utilized to evaluate blood flow to the ovaries.  Comparison:  02/03/2011  Findings:  Uterus:  9.5 x 4.9 x 6.8 cm.  No fibroids or other uterine masses identified.  Endometrium:  Double layer thickness measures 4 mm transvaginally. No focal lesion visualized.  Right ovary: 4.5 x 3.6 x 3.1 cm.  Normal appearance.  Left ovary:   3.4 x 2.4 x 2.5 cm.  Normal appearance.  Pulsed Doppler evaluation demonstrates normal low-resistance arterial and venous waveforms in both ovaries.  IMPRESSION: Normal exam.  No evidence of pelvic mass or other significant abnormality.  No sonographic evidence for ovarian torsion.  Original Report Authenticated By: Danae Orleans, M.D.   Ct Abdomen Pelvis W Contrast  04/09/2012  *RADIOLOGY REPORT*  Clinical Data: Right lower quadrant pain.  Nausea and vomiting. Previous cholecystectomy.  CT ABDOMEN AND PELVIS WITH CONTRAST  Technique:  Multidetector CT imaging of the abdomen and pelvis was performed following the standard  protocol during bolus administration of intravenous contrast.  Contrast: 100 ml Omnipaque-300 and oral contrast  Comparison: 12/17/2011  Findings: The abdominal parenchymal organs are normal in appearance.  Surgical clips are seen from prior cholecystectomy. No evidence of biliary dilatation.  No evidence of hydronephrosis.  No soft tissue masses or lymphadenopathy identified within the abdomen or pelvis.  Uterus and adnexa are unremarkable in appearance.  No evidence of inflammatory process or abnormal fluid collections. Normal appendix is visualized.  No evidence of dilated bowel loops.  A tiny epigastric ventral hernia is again seen containing small amount of fat.  No evidence of herniated bowel loops.  IMPRESSION:  1.  No evidence of appendicitis or other acute findings. 2.  Stable small epigastric ventral hernia containing only fat.  Original Report Authenticated By: Danae Orleans, M.D.   [place] Art/ven Flow Abd Pelv Doppler  04/09/2012  *RADIOLOGY REPORT*  Clinical Data:  Severe right-sided pelvic pain.  Nausea and vomiting.  Clinical suspicion for ovarian torsion.  TRANSABDOMINAL AND TRANSVAGINAL ULTRASOUND OF PELVIS DOPPLER ULTRASOUND OF OVARIES  Technique:  Both transabdominal and transvaginal ultrasound examinations of the pelvis were performed. Transabdominal technique was performed for global imaging of the pelvis including uterus, ovaries, adnexal regions, and pelvic cul-de-sac.  It was necessary to proceed with endovaginal exam following the transabdominal exam to visualize the endometrium and ovaries.  Color and duplex Doppler ultrasound was utilized to evaluate blood flow to the ovaries.  Comparison:  02/03/2011  Findings:  Uterus:  9.5 x 4.9 x 6.8 cm.  No fibroids or other uterine masses identified.  Endometrium:  Double layer thickness measures 4 mm transvaginally. No focal lesion visualized.  Right ovary: 4.5 x 3.6 x 3.1 cm.  Normal appearance.  Left ovary:   3.4 x 2.4 x 2.5 cm.  Normal appearance.   Pulsed Doppler evaluation demonstrates normal low-resistance arterial and venous waveforms in both ovaries.  IMPRESSION: Normal exam.  No evidence of pelvic mass or other significant abnormality.  No sonographic evidence for ovarian torsion.  Original Report Authenticated By: Danae Orleans, M.D.     1. Abdominal  pain, other specified site       MDM   Patient with right lower quadrant abdominal pain that started yesterday with persistent vomiting. She denies any dysuria and has rebound and guarding in the right lower quadrant and pelvic area.  Will do pelvic exam to see if there is a lot of adnexal tenderness however differential includes kidney stone vs ovarian cyst rupture versus appendicitis. CBC, CMP, UA pending. UPT is negative the patient denies any vaginal symptoms.  Ua withtrace blood but  o/w unremarkable other than mild contamination.  7:44 PM Labs are within normal limits however pelvic exam with severe tenderness over the right adnexa. Will do an ultrasound to further evaluate and patient given pain control.   10:51 PM CT of the abdomen and pelvis is negative for acute pathology. Unclear what is causing the patient's acute right-sided pain. This was discussed with the patient she is more pain control now we'll discharge home to follow up with her PMD if symptoms continue.  Given the trace amount of blood in the uterine this could be a radiolucent stone.  Gwyneth Sprout, MD 04/09/12 1610  Gwyneth Sprout, MD 04/09/12 [id number]

## 2012-04-09 NOTE — Discharge Instructions (Signed)

## 2012-04-09 NOTE — ED Notes (Signed)
Pt c/o lower/ pubic  abd pain x 2 days with n/v

## 2012-04-09 NOTE — ED Notes (Signed)
Attempted IV access x2 unsuccessful 

## 2012-04-09 NOTE — ED Notes (Addendum)
MD at bedside giving test results and dispo plan.  

## 2012-04-09 NOTE — ED Notes (Signed)
MD at bedside. 

## 2012-04-16 ENCOUNTER — Emergency Department (HOSPITAL_BASED_OUTPATIENT_CLINIC_OR_DEPARTMENT_OTHER): Payer: Medicaid Other

## 2012-04-16 ENCOUNTER — Emergency Department (HOSPITAL_BASED_OUTPATIENT_CLINIC_OR_DEPARTMENT_OTHER)
Admission: EM | Admit: 2012-04-16 | Discharge: 2012-04-17 | Disposition: A | Discharge status: Home / Self Care | Payer: Medicaid Other | Attending: Emergency Medicine | Admitting: Emergency Medicine

## 2012-04-16 ENCOUNTER — Encounter (HOSPITAL_BASED_OUTPATIENT_CLINIC_OR_DEPARTMENT_OTHER): Payer: Self-pay | Admitting: *Deleted

## 2012-04-16 DIAGNOSIS — M542 Cervicalgia: Secondary | ICD-10-CM | POA: Insufficient documentation

## 2012-04-16 DIAGNOSIS — Z79899 Other long term (current) drug therapy: Secondary | ICD-10-CM | POA: Insufficient documentation

## 2012-04-16 DIAGNOSIS — Z7982 Long term (current) use of aspirin: Secondary | ICD-10-CM | POA: Insufficient documentation

## 2012-04-16 DIAGNOSIS — R079 Chest pain, unspecified: Secondary | ICD-10-CM | POA: Insufficient documentation

## 2012-04-16 LAB — CBC
HCT: 40.3 % (ref 36.0–46.0)
Hemoglobin: 13.5 g/dL (ref 12.0–15.0)
MCV: 93.5 fL (ref 78.0–100.0)
WBC: 8.5 10*3/uL (ref 4.0–10.5)

## 2012-04-16 LAB — BASIC METABOLIC PANEL
BUN: 9 mg/dL (ref 6–23)
CO2: 26 mEq/L (ref 19–32)
Chloride: 103 mEq/L (ref 96–112)
Creatinine, Ser: 0.8 mg/dL (ref 0.50–1.10)
Glucose, Bld: 76 mg/dL (ref 70–99)
Potassium: 3.6 mEq/L (ref 3.5–5.1)

## 2012-04-16 MED ORDER — MORPHINE SULFATE 2 MG/ML IJ SOLN
INTRAMUSCULAR | Status: AC
  Filled 2012-04-16: qty 1

## 2012-04-16 MED ORDER — MORPHINE SULFATE 4 MG/ML IJ SOLN
6.0000 mg | Freq: Once | INTRAMUSCULAR | Status: AC
  Administered 2012-04-16: 6 mg via INTRAVENOUS
  Filled 2012-04-16: qty 1

## 2012-04-16 NOTE — ED Notes (Signed)
Patient transported to X-ray 

## 2012-04-16 NOTE — ED Notes (Signed)
Left shoulder and neck started hurting about 30 mins ago. Her fingers are tingling. Then her chest started hurting and she got diaphoretic. States she had an MI at 31 years old due to carbon monoxide poisoning.

## 2012-04-17 MED ORDER — CYCLOBENZAPRINE HCL 5 MG PO TABS: 5.0000 mg | ORAL_TABLET | Freq: Two times a day (BID) | ORAL | Status: AC | PRN

## 2012-04-17 MED ORDER — HYDROCODONE-ACETAMINOPHEN 7.5-500 MG/15ML PO SOLN: 10.0000 mL | Freq: Four times a day (QID) | ORAL | Status: AC | PRN

## 2012-04-17 MED ORDER — MORPHINE SULFATE 4 MG/ML IJ SOLN
4.0000 mg | Freq: Once | INTRAMUSCULAR | Status: AC
  Administered 2012-04-17: 4 mg via INTRAVENOUS
  Filled 2012-04-17: qty 1

## 2012-04-17 NOTE — ED Provider Notes (Signed)
History     CSN: [id number]  Arrival date & time 04/16/12  2219   First MD Initiated Contact with Patient 04/16/12 2259      Chief Complaint  Patient presents with  . Chest Pain    (Consider location/radiation/quality/duration/timing/severity/associated sxs/prior treatment) The history is provided by the patient.   the patient reports acute onset left neck and trapezius pain that began approximately 30 minutes prior to arrival.  She's had tingling in her fingertips.  She reports some pressure in her chest.  She denies shortness of breath.  She's had no nausea or vomiting.  She does report that it made her anxious and she began to sweat.  She has never undergone heart catheterization has no coronary artery disease or stents in her heart the patient is a.  She reports having an MI 20 sutures all due to carbon monoxide poisoning however the [place] and are very unclear at this time.  She does smoke cigarettes.  She denies a history of hypertension hyperlipidemia and diabetes.  She does report that her grandmother had a heart attack before the age of 61.  At this time her pain in her left neck persists but she no longer has chest discomfort.  She never has exertional chest discomfort or neck pain.  She's had no recent trauma to this area  Past Medical History  Diagnosis Date  . Acute MI     d/t carbon monoxide poisoning    Past Surgical History  Procedure Date  . Cholecystectomy     No family history on file.  History  Substance Use Topics  . Smoking status: Current Some Day Smoker  . Smokeless tobacco: Not on file  . Alcohol Use: No    OB History    Grav Para Term Preterm Abortions TAB SAB Ect Mult Living                  Review of Systems  Cardiovascular: Positive for chest pain.  All other systems reviewed and are negative.    Allergies  Naproxen  Home Medications   Current Outpatient Rx  Name Route Sig Dispense Refill  .  ASPIRIN 81 MG PO TABS Oral Take 81 mg by mouth daily.    . OXYCODONE-ACETAMINOPHEN 5-325 MG PO TABS Oral Take 1-2 tablets by mouth every 6 (six) hours as needed for pain. 15 tablet 0  . PROMETHAZINE HCL 25 MG PO TABS Oral Take 25 mg by mouth every 6 (six) hours as needed. Patient used this medication for nausea.    . CYCLOBENZAPRINE HCL 5 MG PO TABS Oral Take 1 tablet (5 mg total) by mouth 2 (two) times daily as needed for muscle spasms. 8 tablet 0  . HYDROCODONE-ACETAMINOPHEN 7.5-500 MG/15ML PO SOLN Oral Take 10 mLs by mouth every 6 (six) hours as needed for pain. 60 mL 0  . PROMETHAZINE HCL 25 MG PO TABS Oral Take 1 tablet (25 mg total) by mouth every 6 (six) hours as needed for nausea. 10 tablet 0    BP 135/89  Pulse 86  Temp 97.8 F (36.6 C) (Oral)  Resp 40  SpO2 100%  LMP 04/01/2012  Physical Exam  Nursing note and vitals reviewed. Constitutional: She is oriented to person, place, and time. She appears well-developed and well-nourished. No distress.  HENT:  Head: Normocephalic and atraumatic.  Eyes: EOM are normal.  Neck: Normal range of motion. Neck supple.       Tenderness and  spasm of her left trapezius and sternocleidomastoid.  No rash.  Cardiovascular: Normal rate, regular rhythm and normal heart sounds.   Pulmonary/Chest: Effort normal and breath sounds normal.  Abdominal: Soft. She exhibits no distension. There is no tenderness.  Musculoskeletal: Normal range of motion.       No weakness in her upper extremities  Neurological: She is alert and oriented to person, place, and time.  Skin: Skin is warm and dry.  Psychiatric: Judgment normal.       Tearful    ED Course  Procedures (including critical care time)   Date: 04/17/2012  Rate: 74  Rhythm: normal sinus rhythm  QRS Axis: normal  Intervals: normal  ST/T Wave abnormalities: normal  Conduction Disutrbances: none  Narrative Interpretation:   Old EKG Reviewed: No significant changes noted      Labs  Reviewed  CBC  BASIC METABOLIC PANEL  TROPONIN I   No results found.   1. Neck pain       MDM  The patient's pain seems to be more located in her left neck and seems to be musculoskeletal in nature.  Her cardiac enzymes and EKG are normal.  The patient is well-appearing and she feels better after IV pain medicine.  She was sent home on a short course of pain medicine and muscle relaxants with close PCP followup.  She understands to return the emergency apartment for new or worsening symptoms.  This does not appear to be dissection, carotid dissection, ACS or any other life-threatening pathology        Lyanne Co, MD 04/17/12 [phone]

## 2012-04-17 NOTE — Discharge Instructions (Signed)
Pain of Unknown Etiology (Pain Without a Known Cause) You have come to your caregiver because of pain. Pain can occur in any part of the body. Often there is not a definite cause. If your laboratory (blood or urine) work was normal and x-rays or other studies were normal, your caregiver may treat you without knowing the cause of the pain. An example of this is the headache. Most headaches are diagnosed by taking a history. This means your caregiver asks you questions about your headaches. Your caregiver determines a treatment based on your answers. Usually testing done for headaches is normal. Often testing is not done unless there is no response to medications. Regardless of where your pain is located today, you can be given medications to make you comfortable. If no physical cause of pain can be found, most cases of pain will gradually leave as suddenly as they came.  If you have a painful condition and no reason can be found for the pain, It is importantthat you follow up with your caregiver. If the pain becomes worse or does not go away, it may be necessary to repeat tests and look further for a possible cause.  Only take over-the-counter or prescription medicines for pain, discomfort, or fever as directed by your caregiver.   For the protection of your privacy, test results can not be given over the phone. Make sure you receive the results of your test. Ask as to how these results are to be obtained if you have not been informed. It is your responsibility to obtain your test results.   You may continue all activities unless the activities cause more pain. When the pain lessens, it is important to gradually resume normal activities. Resume activities by beginning slowly and gradually increasing the intensity and duration of the activities or exercise. During periods of severe pain, bed-rest may be helpful. Lay or sit in any position that is comfortable.   Ice used for acute (sudden) conditions may be  effective. Use a large plastic bag filled with ice and wrapped in a towel. This may provide pain relief.   See your caregiver for continued problems. They can help or refer you for exercises or physical therapy if necessary.  If you were given medications for your condition, do not drive, operate machinery or power tools, or sign legal documents for 24 hours. Do not drink alcohol, take sleeping pills, or take other medications that may interfere with treatment. See your caregiver immediately if you have pain that is becoming worse and not relieved by medications. Document Released: 07/04/2001 Document Revised: 09/28/2011 Document Reviewed: 10/09/2005 [employer] Patient Information 2012 [employer]. 

## 2012-05-01 ENCOUNTER — Emergency Department (HOSPITAL_BASED_OUTPATIENT_CLINIC_OR_DEPARTMENT_OTHER)
Admission: EM | Admit: 2012-05-01 | Discharge: 2012-05-01 | Disposition: A | Discharge status: Home / Self Care | Payer: Medicaid Other | Attending: Emergency Medicine | Admitting: Emergency Medicine

## 2012-05-01 ENCOUNTER — Encounter (HOSPITAL_BASED_OUTPATIENT_CLINIC_OR_DEPARTMENT_OTHER): Payer: Self-pay | Admitting: *Deleted

## 2012-05-01 DIAGNOSIS — X58XXXA Exposure to other specified factors, initial encounter: Secondary | ICD-10-CM | POA: Insufficient documentation

## 2012-05-01 DIAGNOSIS — S139XXA Sprain of joints and ligaments of unspecified parts of neck, initial encounter: Secondary | ICD-10-CM | POA: Insufficient documentation

## 2012-05-01 DIAGNOSIS — F17200 Nicotine dependence, unspecified, uncomplicated: Secondary | ICD-10-CM | POA: Insufficient documentation

## 2012-05-01 DIAGNOSIS — I252 Old myocardial infarction: Secondary | ICD-10-CM | POA: Insufficient documentation

## 2012-05-01 DIAGNOSIS — S161XXA Strain of muscle, fascia and tendon at neck level, initial encounter: Secondary | ICD-10-CM

## 2012-05-01 DIAGNOSIS — Z7982 Long term (current) use of aspirin: Secondary | ICD-10-CM | POA: Insufficient documentation

## 2012-05-01 MED ORDER — OXYCODONE-ACETAMINOPHEN 5-325 MG PO TABS: 2.0000 | ORAL_TABLET | ORAL | Status: DC | PRN

## 2012-05-01 MED ORDER — OXYCODONE-ACETAMINOPHEN 5-325 MG PO TABS
2.0000 | ORAL_TABLET | Freq: Once | ORAL | Status: AC
  Administered 2012-05-01: 2 via ORAL
  Filled 2012-05-01: qty 2

## 2012-05-01 NOTE — ED Notes (Signed)
Pt c/o left shoulder pain and upper back pain x 1 hr

## 2012-05-01 NOTE — ED Provider Notes (Signed)
History     CSN: [id number]  Arrival date & time 05/01/12  1800   First MD Initiated Contact with Patient 05/01/12 1821      Chief Complaint  Patient presents with  . Shoulder Pain    (Consider location/radiation/quality/duration/timing/severity/associated sxs/prior treatment) HPI Comments: Patient presents today complaining of left-sided neck pain. She says she woke up with a discomfort in the left side of her neck this morning and it's been radiating down her left arm. It's worse with movement. She has a history of anxiety and says that she's been having some panic attacks during the day. She's been feeling short of breath and having a little tightness in the anterior part of her left shoulder however she feels like this is more related to her anxiety. She states she's had a history of MI 6 years ago however, she states that this was due to carbon monoxide poisoning. She denies having a cardiac catheterization. She's had some ongoing problems with anxiety and is currently not prescribing medications for her anxiety she denies any pleuritic-type chest pain. She's had some nausea but no diaphoresis. Denies any leg pain or swelling. Denies a recent injuries. Denies any numbness or weakness in her hand  The history is provided by the patient.    Past Medical History  Diagnosis Date  . Acute MI     d/t carbon monoxide poisoning    Past Surgical History  Procedure Date  . Cholecystectomy     History reviewed. No pertinent family history.  History  Substance Use Topics  . Smoking status: Current Some Day Smoker  . Smokeless tobacco: Not on file  . Alcohol Use: No    OB History    Grav Para Term Preterm Abortions TAB SAB Ect Mult Living                  Review of Systems  Constitutional: Negative for fever, chills, diaphoresis and fatigue.  HENT: Positive for neck pain. Negative for congestion, rhinorrhea and sneezing.   Eyes: Negative.   Respiratory: Negative for cough,  chest tightness and shortness of breath.   Cardiovascular: Negative for chest pain and leg swelling.  Gastrointestinal: Negative for nausea, vomiting, abdominal pain, diarrhea and blood in stool.  Genitourinary: Negative for frequency, hematuria, flank pain and difficulty urinating.  Musculoskeletal: Negative for back pain and arthralgias.  Skin: Negative for rash.  Neurological: Negative for dizziness, speech difficulty, weakness, numbness and headaches.    Allergies  Naproxen  Home Medications   Current Outpatient Rx  Name Route Sig Dispense Refill  . ASPIRIN 81 MG PO TABS Oral Take 81 mg by mouth daily.    . IBUPROFEN 200 MG PO TABS Oral Take 600 mg by mouth every 6 (six) hours as needed. Patient used this medication for her headache.    . OXYCODONE-ACETAMINOPHEN 5-325 MG PO TABS Oral Take 1 tablet by mouth every 4 (four) hours as needed. Patient used this medication for pain.    . OXYCODONE-ACETAMINOPHEN 5-325 MG PO TABS Oral Take 2 tablets by mouth every 4 (four) hours as needed for pain. 15 tablet 0  . PROMETHAZINE HCL 25 MG PO TABS Oral Take 1 tablet (25 mg total) by mouth every 6 (six) hours as needed for nausea. 10 tablet 0  . PROMETHAZINE HCL 25 MG PO TABS Oral Take 25 mg by mouth every 6 (six) hours as needed. Patient used this medication for nausea.      BP 144/89  Pulse 87  Temp 98.1 F (36.7 C) (Oral)  Resp 18  Ht 5\' 7"  (1.702 m)  Wt 258 lb (117.028 kg)  BMI 40.41 kg/m2  SpO2 100%  LMP 03/24/2012  Physical Exam  Constitutional: She is oriented to person, place, and time. She appears well-developed and well-nourished.  HENT:  Head: Normocephalic and atraumatic.  Eyes: Pupils are equal, round, and reactive to light.  Neck: Normal range of motion. Neck supple.  Cardiovascular: Normal rate, regular rhythm and normal heart sounds.   Pulmonary/Chest: Effort normal and breath sounds normal. No respiratory distress. She has no wheezes. She has no rales. She exhibits  tenderness (Mild tenderness on palpation of the left upper chest wall).  Abdominal: Soft. Bowel sounds are normal. There is no tenderness. There is no rebound and no guarding.  Musculoskeletal: Normal range of motion. She exhibits tenderness. She exhibits no edema.       She has normal sensation the left hand. Normal motor function left hand. Normal pulses in the hand  Lymphadenopathy:    She has no cervical adenopathy.  Neurological: She is alert and oriented to person, place, and time.  Skin: Skin is warm and dry. No rash noted.  Psychiatric: She has a normal mood and affect.    ED Course  Procedures (including critical care time)   Date: 05/01/2012  Rate: 74  Rhythm: normal sinus rhythm  QRS Axis: normal  Intervals: normal  ST/T Wave abnormalities: normal  Conduction Disutrbances:none  Narrative Interpretation:   Old EKG Reviewed: unchanged    1. Neck strain       MDM  Patient presents with left-sided neck pain. This seems to be musculoskeletal in nature. I have very low suspicion for acute coronary syndrome there is nothing to suggest pulmonary embolus pneumothorax, pneumonia or other cardiopulmonary disease. Will treat her with a short course of pain medicines encourage her followup with her primary care physician as soon as possible return here if she has any worsening symptoms        Rolan Bucco, MD 05/01/12 2009

## 2012-05-06 ENCOUNTER — Emergency Department (HOSPITAL_COMMUNITY)
Admission: EM | Admit: 2012-05-06 | Discharge: 2012-05-07 | Disposition: A | Discharge status: Home / Self Care | Payer: Medicaid Other | Attending: Emergency Medicine | Admitting: Emergency Medicine

## 2012-05-06 ENCOUNTER — Encounter (HOSPITAL_COMMUNITY): Payer: Self-pay | Admitting: Emergency Medicine

## 2012-05-06 DIAGNOSIS — R1031 Right lower quadrant pain: Secondary | ICD-10-CM | POA: Insufficient documentation

## 2012-05-06 DIAGNOSIS — R112 Nausea with vomiting, unspecified: Secondary | ICD-10-CM | POA: Insufficient documentation

## 2012-05-06 DIAGNOSIS — F17200 Nicotine dependence, unspecified, uncomplicated: Secondary | ICD-10-CM | POA: Insufficient documentation

## 2012-05-06 DIAGNOSIS — I252 Old myocardial infarction: Secondary | ICD-10-CM | POA: Insufficient documentation

## 2012-05-06 DIAGNOSIS — R109 Unspecified abdominal pain: Secondary | ICD-10-CM

## 2012-05-06 DIAGNOSIS — Z79899 Other long term (current) drug therapy: Secondary | ICD-10-CM | POA: Insufficient documentation

## 2012-05-06 DIAGNOSIS — R197 Diarrhea, unspecified: Secondary | ICD-10-CM | POA: Insufficient documentation

## 2012-05-06 NOTE — ED Notes (Signed)
Pt states she has abd pain, nausea, vomiting, and diarrhea that started 3 days ago   Pt states her pain is in her RLQ and describes the pain as sharp and constant

## 2012-05-07 LAB — URINALYSIS, ROUTINE W REFLEX MICROSCOPIC
Bilirubin Urine: NEGATIVE
Glucose, UA: NEGATIVE mg/dL
Hgb urine dipstick: NEGATIVE
Specific Gravity, Urine: 1.033 — ABNORMAL HIGH (ref 1.005–1.030)
Urobilinogen, UA: 0.2 mg/dL (ref 0.0–1.0)
pH: 5.5 (ref 5.0–8.0)

## 2012-05-07 LAB — COMPREHENSIVE METABOLIC PANEL
ALT: 15 U/L (ref 0–35)
AST: 16 U/L (ref 0–37)
CO2: 23 mEq/L (ref 19–32)
Chloride: 100 mEq/L (ref 96–112)
Creatinine, Ser: 0.82 mg/dL (ref 0.50–1.10)
GFR calc non Af Amer: >90 mL/min (ref >90)
Glucose, Bld: 88 mg/dL (ref 70–99)
Sodium: 135 mEq/L (ref 135–145)
Total Bilirubin: 0.2 mg/dL — ABNORMAL LOW (ref 0.3–1.2)

## 2012-05-07 LAB — CBC WITH DIFFERENTIAL/PLATELET
Basophils Absolute: 0 10*3/uL (ref 0.0–0.1)
HCT: 40.9 % (ref 36.0–46.0)
Hemoglobin: 13.9 g/dL (ref 12.0–15.0)
Lymphocytes Relative: 33 % (ref 12–46)
Lymphs Abs: 3.7 10*3/uL (ref 0.7–4.0)
Monocytes Absolute: 0.6 10*3/uL (ref 0.1–1.0)
Neutro Abs: 6.7 10*3/uL (ref 1.7–7.7)
RBC: 4.37 MIL/uL (ref 3.87–5.11)
RDW: 13.3 % (ref 11.5–15.5)
WBC: 11.1 10*3/uL — ABNORMAL HIGH (ref 4.0–10.5)

## 2012-05-07 LAB — POCT PREGNANCY, URINE: Preg Test, Ur: NEGATIVE

## 2012-05-07 MED ORDER — SODIUM CHLORIDE 0.9 % IV BOLUS (SEPSIS)
1000.0000 mL | Freq: Once | INTRAVENOUS | Status: AC
  Administered 2012-05-07: 1000 mL via INTRAVENOUS

## 2012-05-07 MED ORDER — MORPHINE SULFATE 4 MG/ML IJ SOLN
4.0000 mg | Freq: Once | INTRAMUSCULAR | Status: AC
Start: 2012-05-07 — End: 2012-05-07
  Administered 2012-05-07: 4 mg via INTRAVENOUS
  Filled 2012-05-07: qty 1

## 2012-05-07 MED ORDER — TRAMADOL HCL 50 MG PO TABS: 50.0000 mg | ORAL_TABLET | Freq: Four times a day (QID) | ORAL | Status: AC | PRN

## 2012-05-07 MED ORDER — ONDANSETRON HCL 4 MG/2ML IJ SOLN
4.0000 mg | Freq: Once | INTRAMUSCULAR | Status: AC
  Administered 2012-05-07: 4 mg via INTRAVENOUS
  Filled 2012-05-07: qty 2

## 2012-05-07 MED ORDER — MORPHINE SULFATE 4 MG/ML IJ SOLN
4.0000 mg | Freq: Once | INTRAMUSCULAR | Status: AC
  Administered 2012-05-07: 4 mg via INTRAVENOUS
  Filled 2012-05-07: qty 1

## 2012-05-07 MED ORDER — ONDANSETRON 4 MG PO TBDP: 4.0000 mg | ORAL_TABLET | Freq: Three times a day (TID) | ORAL | Status: AC | PRN

## 2012-05-07 NOTE — ED Provider Notes (Signed)
History     CSN: [id number]  Arrival date & time 05/06/12  2336   First MD Initiated Contact with Patient 05/07/12 0030      Chief Complaint  Patient presents with  . Abdominal Pain  . Emesis  . Diarrhea    (Consider location/radiation/quality/duration/timing/severity/associated sxs/prior treatment) HPI Comments: Pt with sharp, stabbing, intermittent abd pain to rlq x 3 days. Not sig changed since onset. No known aggravating/alleviating factors. No tx prior to coming here. Associated with nausea, vomiting, diarrhea, decr appetite. No vaginal bleeding or dc. LMP 2 weeks ago. Currently sexually active with single female partner. Hx ovarian cysts previously. Abd surg hx sig for cholecystectomy.  Patient is a 31 y.o. female presenting with abdominal pain. The history is provided by the patient.  Abdominal Pain The primary symptoms of the illness include abdominal pain, nausea, vomiting and diarrhea. The primary symptoms of the illness do not include fever, fatigue, hematochezia, dysuria, vaginal discharge or vaginal bleeding. Episode onset: 3 d ago. The onset of the illness was gradual. The problem has not changed since onset. The abdominal pain began more than 2 days ago. The abdominal pain has been gradually improving since its onset. The abdominal pain is located in the RLQ and suprapubic region. The abdominal pain does not radiate. The abdominal pain is relieved by nothing.  The vomiting began more than 2 days ago. The emesis contains stomach contents.  The diarrhea began 3 to 5 days ago. The diarrhea is watery.  The patient states that she believes she is currently not pregnant. The patient has had a change in bowel habit. Risk factors for an acute abdominal problem include a history of abdominal surgery.    Past Medical History  Diagnosis Date  . Acute MI     d/t carbon monoxide poisoning    Past Surgical History  Procedure Date  . Cholecystectomy     Family History  Problem  Relation Age of Onset  . Coronary artery disease Other   . Hypertension Other   . Diabetes Other     History  Substance Use Topics  . Smoking status: Current Some Day Smoker    Types: Cigarettes  . Smokeless tobacco: Not on file  . Alcohol Use: No    OB History    Grav Para Term Preterm Abortions TAB SAB Ect Mult Living                  Review of Systems  Constitutional: Negative for fever and fatigue.  Gastrointestinal: Positive for nausea, vomiting, abdominal pain and diarrhea. Negative for hematochezia.  Genitourinary: Negative for dysuria, vaginal bleeding and vaginal discharge.  ROS as per HPI, 10 pt ROS otherwise neg  Allergies  Naproxen  Home Medications   Current Outpatient Rx  Name Route Sig Dispense Refill  . ASPIRIN 81 MG PO TABS Oral Take 81 mg by mouth daily.    . IBUPROFEN 200 MG PO TABS Oral Take 600 mg by mouth every 6 (six) hours as needed. Patient used this medication for her headache.    . ADULT MULTIVITAMIN W/MINERALS CH Oral Take 1 tablet by mouth daily.    . OXYCODONE-ACETAMINOPHEN 5-325 MG PO TABS Oral Take 1 tablet by mouth every 4 (four) hours as needed. Patient used this medication for pain.    Marland Kitchen PROMETHAZINE HCL 25 MG PO TABS Oral Take 1 tablet (25 mg total) by mouth every 6 (six) hours as needed for nausea. 10 tablet 0    BP  116/66  Pulse 88  Temp 98.1 F (36.7 C) (Oral)  Resp 18  Ht 5\' 7"  (1.702 m)  Wt 298 lb (135.172 kg)  BMI 46.67 kg/m2  SpO2 100%  LMP 04/26/2012  Physical Exam  Nursing note and vitals reviewed. Constitutional: She appears well-developed and well-nourished. No distress.  HENT:  Head: Normocephalic and atraumatic.  Eyes:       Normal appearance  Neck: Normal range of motion.  Cardiovascular: Normal rate, regular rhythm and normal heart sounds.  Exam reveals no gallop and no friction rub.   No murmur heard. Pulmonary/Chest: Effort normal and breath sounds normal. She exhibits no tenderness.  Abdominal: Soft.  Bowel sounds are normal. There is tenderness in the right lower quadrant. There is no CVA tenderness and no tenderness at McBurney's point.       Negative jar, rovsing, obturator testing  Genitourinary:       NT chaperone present during exam Normal external genitalia Cervical os closed, no discharge, no CMT Mild tenderness to R adnexa without palp mass  Musculoskeletal: Normal range of motion.  Neurological: She is alert.  Skin: Skin is warm and dry. She is not diaphoretic.  Psychiatric: She has a normal mood and affect.    ED Course  Procedures (including critical care time)  Labs Reviewed  URINALYSIS, ROUTINE W REFLEX MICROSCOPIC - Abnormal; Notable for the following:    APPearance CLOUDY (*)     Specific Gravity, Urine 1.033 (*)     All other components within normal limits  CBC WITH DIFFERENTIAL - Abnormal; Notable for the following:    WBC 11.1 (*)     All other components within normal limits  COMPREHENSIVE METABOLIC PANEL - Abnormal; Notable for the following:    Total Bilirubin 0.2 (*)     All other components within normal limits  WET PREP, GENITAL - Abnormal; Notable for the following:    WBC, Wet Prep HPF POC MODERATE (*)     All other components within normal limits  POCT PREGNANCY, URINE  LIPASE, BLOOD  GC/CHLAMYDIA PROBE AMP, GENITAL   No results found.   No diagnosis found.    MDM  Pt presents with abd pain, n/v/d x 3 days. Mildly tender to palp to RLQ. Negative peritoneal signs. Labs do not show gross evidence of dehydration. Very mild leukocytosis without L shift. Mod leuks on wet prep which may be secondary to physiologic dc. Review of previous chart indicates pt was seen for similar sx last month and had neg CT and [place] at that visit. Based on hx, exam, and findings I doubt acute abd at this time. Discussed serial abd exams/reasons to return should her sx change or worsen.         Grant Fontana, PA-C 05/07/12 1624

## 2012-05-07 NOTE — ED Notes (Signed)
Gold, lavender, and light green tubes sent to Lab.

## 2012-05-07 NOTE — ED Notes (Signed)
Pt reports right lower abdominal pain for past three days. States that she has had nausea, vomiting, and diarrhea with the abdominal pain. Pt denies dysuria, vaginal discharge or vaginal odor.

## 2012-05-08 LAB — GC/CHLAMYDIA PROBE AMP, GENITAL
Chlamydia, DNA Probe: NEGATIVE
GC Probe Amp, Genital: NEGATIVE

## 2012-05-17 NOTE — ED Provider Notes (Signed)
Medical screening examination/treatment/procedure(s) were performed by non-physician practitioner and as supervising physician I was immediately available for consultation/collaboration.  Jasmine Awe, MD 05/17/12 2311

## 2012-05-30 ENCOUNTER — Encounter (HOSPITAL_BASED_OUTPATIENT_CLINIC_OR_DEPARTMENT_OTHER): Payer: Self-pay | Admitting: *Deleted

## 2012-05-30 ENCOUNTER — Emergency Department (HOSPITAL_BASED_OUTPATIENT_CLINIC_OR_DEPARTMENT_OTHER): Payer: Medicaid Other

## 2012-05-30 ENCOUNTER — Emergency Department (HOSPITAL_BASED_OUTPATIENT_CLINIC_OR_DEPARTMENT_OTHER)
Admission: EM | Admit: 2012-05-30 | Discharge: 2012-05-30 | Disposition: A | Discharge status: Home / Self Care | Payer: Medicaid Other | Attending: Emergency Medicine | Admitting: Emergency Medicine

## 2012-05-30 DIAGNOSIS — J069 Acute upper respiratory infection, unspecified: Secondary | ICD-10-CM

## 2012-05-30 DIAGNOSIS — J40 Bronchitis, not specified as acute or chronic: Secondary | ICD-10-CM

## 2012-05-30 DIAGNOSIS — F17200 Nicotine dependence, unspecified, uncomplicated: Secondary | ICD-10-CM | POA: Insufficient documentation

## 2012-05-30 MED ORDER — BENZONATATE 100 MG PO CAPS: 100.0000 mg | ORAL_CAPSULE | Freq: Three times a day (TID) | ORAL | Status: DC

## 2012-05-30 MED ORDER — PREDNISONE 10 MG PO TABS: 40.0000 mg | ORAL_TABLET | Freq: Every day | ORAL | Status: DC

## 2012-05-30 MED ORDER — ALBUTEROL SULFATE HFA 108 (90 BASE) MCG/ACT IN AERS: 1.0000 | INHALATION_SPRAY | Freq: Four times a day (QID) | RESPIRATORY_TRACT | Status: DC | PRN

## 2012-05-30 NOTE — ED Provider Notes (Signed)
History     CSN: [id number]  Arrival date & time 05/30/12  1154   First MD Initiated Contact with Patient 05/30/12 1204      Chief Complaint  Patient presents with  . Cough  . Nasal Congestion    (Consider location/radiation/quality/duration/timing/severity/associated sxs/prior treatment) Patient is a 31 y.o. female presenting with cough. The history is provided by the patient.  Cough This is a new problem. The current episode started more than 1 week ago. The problem occurs constantly. The problem has not changed since onset.The cough is productive of sputum. There has been no fever. Associated symptoms include chills, sweats, rhinorrhea, sore throat and myalgias. Pertinent negatives include no chest pain, no ear pain, no headaches, no shortness of breath, no wheezing and no eye redness. She has tried decongestants and cough syrup for the symptoms. She is a smoker.  Pt with cough and cold symptoms for just over a week. States everyone in household sick with the same. Has tried over the counter medications with no relief. Denies fever, headache, neck pain or stiffness. States nausea, vomiting, some intermittent diarrhea. Nothing makes symptoms better.  Past Medical History  Diagnosis Date  . Acute MI     d/t carbon monoxide poisoning    Past Surgical History  Procedure Date  . Cholecystectomy     Family History  Problem Relation Age of Onset  . Coronary artery disease Other   . Hypertension Other   . Diabetes Other     History  Substance Use Topics  . Smoking status: Current Some Day Smoker    Types: Cigarettes  . Smokeless tobacco: Not on file  . Alcohol Use: No    OB History    Grav Para Term Preterm Abortions TAB SAB Ect Mult Living                  Review of Systems  Constitutional: Positive for chills and fatigue.  HENT: Positive for sore throat, rhinorrhea, postnasal drip and sinus pressure. Negative for ear pain, trouble swallowing, neck pain and neck  stiffness.   Eyes: Negative for pain and redness.  Respiratory: Positive for cough. Negative for chest tightness, shortness of breath and wheezing.   Cardiovascular: Negative for chest pain, palpitations and leg swelling.  Gastrointestinal: Positive for nausea, vomiting and diarrhea. Negative for abdominal pain.  Musculoskeletal: Positive for myalgias.  Skin: Negative.   Neurological: Negative for weakness, numbness and headaches.    Allergies  Naproxen  Home Medications   Current Outpatient Rx  Name Route Sig Dispense Refill  . ASPIRIN 81 MG PO TABS Oral Take 81 mg by mouth daily.    . IBUPROFEN 200 MG PO TABS Oral Take 600 mg by mouth every 6 (six) hours as needed. Patient used this medication for her headache.    . ADULT MULTIVITAMIN W/MINERALS CH Oral Take 1 tablet by mouth daily.    . OXYCODONE-ACETAMINOPHEN 5-325 MG PO TABS Oral Take 1 tablet by mouth every 4 (four) hours as needed. Patient used this medication for pain.    Marland Kitchen PROMETHAZINE HCL 25 MG PO TABS Oral Take 1 tablet (25 mg total) by mouth every 6 (six) hours as needed for nausea. 10 tablet 0    BP 133/96  Pulse 93  Temp 98.3 F (36.8 C) (Oral)  Resp 18  Ht 5\' 7"  (1.702 m)  Wt 218 lb (98.884 kg)  BMI 34.14 kg/m2  SpO2 100%  LMP 05/25/2012  Physical Exam  Nursing note and vitals  reviewed. Constitutional: She appears well-developed and well-nourished. No distress.  HENT:  Head: Normocephalic.  Right Ear: External ear normal.  Left Ear: External ear normal.  Mouth/Throat: Oropharynx is clear and moist.       Nasal congestion, TMs normal bilaterally  Eyes: Conjunctivae are normal.  Neck: Neck supple.  Cardiovascular: Normal rate, regular rhythm and normal heart sounds.   Pulmonary/Chest: Effort normal and breath sounds normal. No respiratory distress. She has no wheezes. She has no rales.       Tender to palpation over left scapula and into left shoulder. Pain with left should ROM, deep breaths, cough    Abdominal: Soft. Bowel sounds are normal. She exhibits no distension. There is no tenderness.  Musculoskeletal: She exhibits no edema.  Lymphadenopathy:    She has no cervical adenopathy.  Skin: Skin is warm and dry.    ED Course  Procedures (including critical care time)  Pt with URI symptoms, cough for a week. Pain with ROM of left shoulder and pain over left scapula with breathing, coughing. Will get CXR to r/o pneumothorax, vs pneumonia. VS normal. She is in NAD.  Filed Vitals:   05/30/12 1205  BP: 133/96  Pulse: 93  Temp: 98.3 F (36.8 C)  Resp: 18   Dg Chest 2 View  05/30/2012  *RADIOLOGY REPORT*  Clinical Data: Cough, nasal congestion, shortness of breath.  CHEST - 2 VIEW  Comparison: 04/16/2012  Findings: Heart and mediastinal contours are within normal limits. No focal opacities or effusions.  No acute bony abnormality.  IMPRESSION: No active cardiopulmonary disease.  Original Report Authenticated By: Cyndie Chime, M.D.     1:12 PM CXR negative. Vs normal. Pt non toxic. wll treat for bronchitis, symptomatic tx. Do not think this is bacterial given negative CXR, no fever, normal vs. Will follow up closely with PCP.    1. Bronchitis   2. URI (upper respiratory infection)       MDM          Lottie Mussel, PA 05/30/12 1312

## 2012-05-30 NOTE — ED Notes (Signed)
Pt amb to room 11 with quick steady gait in nad wearing mask. Pt reports one week of cough, congestion and arm soreness. Pt states she has hx of pinched nerve in her right arm and it feels like that is flaring up.

## 2012-05-31 NOTE — ED Provider Notes (Signed)
Medical screening examination/treatment/procedure(s) were performed by non-physician practitioner and as supervising physician I was immediately available for consultation/collaboration. Devoria Albe, MD, FACEP   Ward Givens, MD 05/31/12 [phone]

## 2012-06-07 ENCOUNTER — Emergency Department (HOSPITAL_COMMUNITY)
Admission: EM | Admit: 2012-06-07 | Discharge: 2012-06-08 | Disposition: A | Discharge status: Home / Self Care | Payer: Medicaid Other | Attending: Emergency Medicine | Admitting: Emergency Medicine

## 2012-06-07 ENCOUNTER — Encounter (HOSPITAL_COMMUNITY): Payer: Self-pay | Admitting: *Deleted

## 2012-06-07 DIAGNOSIS — R1031 Right lower quadrant pain: Secondary | ICD-10-CM | POA: Insufficient documentation

## 2012-06-07 DIAGNOSIS — R197 Diarrhea, unspecified: Secondary | ICD-10-CM | POA: Insufficient documentation

## 2012-06-07 DIAGNOSIS — I252 Old myocardial infarction: Secondary | ICD-10-CM | POA: Insufficient documentation

## 2012-06-07 DIAGNOSIS — R109 Unspecified abdominal pain: Secondary | ICD-10-CM | POA: Insufficient documentation

## 2012-06-07 DIAGNOSIS — R1110 Vomiting, unspecified: Secondary | ICD-10-CM | POA: Insufficient documentation

## 2012-06-07 DIAGNOSIS — Z79899 Other long term (current) drug therapy: Secondary | ICD-10-CM | POA: Insufficient documentation

## 2012-06-07 DIAGNOSIS — F17200 Nicotine dependence, unspecified, uncomplicated: Secondary | ICD-10-CM | POA: Insufficient documentation

## 2012-06-07 LAB — COMPREHENSIVE METABOLIC PANEL
ALT: 14 U/L (ref 0–35)
Albumin: 3.8 g/dL (ref 3.5–5.2)
Alkaline Phosphatase: 82 U/L (ref 39–117)
Potassium: 4.3 mEq/L (ref 3.5–5.1)
Sodium: 139 mEq/L (ref 135–145)
Total Protein: 7.2 g/dL (ref 6.0–8.3)

## 2012-06-07 LAB — URINALYSIS, ROUTINE W REFLEX MICROSCOPIC
Ketones, ur: NEGATIVE mg/dL
Leukocytes, UA: NEGATIVE
Nitrite: NEGATIVE
pH: 8 (ref 5.0–8.0)

## 2012-06-07 LAB — CBC WITH DIFFERENTIAL/PLATELET
Basophils Relative: 0 % (ref 0–1)
Eosinophils Absolute: 0.1 10*3/uL (ref 0.0–0.7)
Lymphs Abs: 1.8 10*3/uL (ref 0.7–4.0)
MCH: 32.3 pg (ref 26.0–34.0)
MCHC: 34.6 g/dL (ref 30.0–36.0)
Neutrophils Relative %: 70 % (ref 43–77)
Platelets: 223 10*3/uL (ref 150–400)
RBC: 4.27 MIL/uL (ref 3.87–5.11)

## 2012-06-07 LAB — PREGNANCY, URINE: Preg Test, Ur: NEGATIVE

## 2012-06-07 NOTE — ED Notes (Signed)
Rt abd pain with v=nv and diarrhea.  Since yesterday.  lmp  Aug 2nd

## 2012-06-08 ENCOUNTER — Emergency Department (HOSPITAL_COMMUNITY): Payer: Medicaid Other

## 2012-06-08 MED ORDER — ONDANSETRON 4 MG PO TBDP
ORAL_TABLET | ORAL | Status: AC
  Filled 2012-06-08: qty 1

## 2012-06-08 MED ORDER — ONDANSETRON HCL 4 MG/2ML IJ SOLN
4.0000 mg | Freq: Once | INTRAMUSCULAR | Status: AC
  Administered 2012-06-08: 4 mg via INTRAMUSCULAR
  Filled 2012-06-08: qty 2

## 2012-06-08 MED ORDER — ONDANSETRON HCL 4 MG PO TABS: 4.0000 mg | ORAL_TABLET | Freq: Four times a day (QID) | ORAL | Status: AC

## 2012-06-08 MED ORDER — HYDROCODONE-ACETAMINOPHEN 5-500 MG PO TABS: 1.0000 | ORAL_TABLET | Freq: Four times a day (QID) | ORAL | Status: AC | PRN

## 2012-06-08 MED ORDER — DICYCLOMINE HCL 10 MG/ML IM SOLN
20.0000 mg | Freq: Once | INTRAMUSCULAR | Status: AC
  Administered 2012-06-08: 20 mg via INTRAMUSCULAR
  Filled 2012-06-08: qty 2

## 2012-06-08 MED ORDER — HYDROCODONE-ACETAMINOPHEN 5-325 MG PO TABS
1.0000 | ORAL_TABLET | Freq: Once | ORAL | Status: AC
  Administered 2012-06-08: 1 via ORAL
  Filled 2012-06-08: qty 1

## 2012-06-08 NOTE — ED Provider Notes (Signed)
History     CSN: [id number]  Arrival date & time 06/07/12  2232   First MD Initiated Contact with Patient 06/07/12 2357      Chief Complaint  Patient presents with  . Abdominal Pain    (Consider location/radiation/quality/duration/timing/severity/associated sxs/prior treatment) Patient is a 31 y.o. female presenting with abdominal pain. The history is provided by the patient.  Abdominal Pain The primary symptoms of the illness include abdominal pain, vomiting and diarrhea. The current episode started yesterday. The onset of the illness was sudden. The problem has not changed since onset. The abdominal pain began yesterday. The pain came on gradually. The abdominal pain has been unchanged since its onset. The abdominal pain is located in the RLQ. The abdominal pain does not radiate. The severity of the abdominal pain is 7/10. The abdominal pain is relieved by nothing.  The vomiting began yesterday. Vomiting occurred once. The emesis contains stomach contents.  The diarrhea began yesterday. The diarrhea is watery. The diarrhea occurs once per day.  The patient states that she believes she is currently not pregnant. The patient has not had a change in bowel habit.    Past Medical History  Diagnosis Date  . Acute MI     d/t carbon monoxide poisoning    Past Surgical History  Procedure Date  . Cholecystectomy     Family History  Problem Relation Age of Onset  . Coronary artery disease Other   . Hypertension Other   . Diabetes Other     History  Substance Use Topics  . Smoking status: Current Some Day Smoker    Types: Cigarettes  . Smokeless tobacco: Not on file  . Alcohol Use: No    OB History    Grav Para Term Preterm Abortions TAB SAB Ect Mult Living                  Review of Systems  Gastrointestinal: Positive for vomiting, abdominal pain and diarrhea.  All other systems reviewed and are negative.    Allergies  Naproxen  Home Medications   Current  Outpatient Rx  Name Route Sig Dispense Refill  . ALBUTEROL SULFATE HFA 108 (90 BASE) MCG/ACT IN AERS Inhalation Inhale 1-2 puffs into the lungs every 6 (six) hours as needed for wheezing. 1 Inhaler 0  . ASPIRIN 81 MG PO TABS Oral Take 81 mg by mouth daily.    Marland Kitchen BENZONATATE 100 MG PO CAPS Oral Take 1 capsule (100 mg total) by mouth every 8 (eight) hours. 21 capsule 0  . ADULT MULTIVITAMIN W/MINERALS CH Oral Take 1 tablet by mouth daily.    Marland Kitchen PREDNISONE 10 MG PO TABS Oral Take 4 tablets (40 mg total) by mouth daily. 20 tablet 0  . PROMETHAZINE HCL 25 MG PO TABS Oral Take 1 tablet (25 mg total) by mouth every 6 (six) hours as needed for nausea. 10 tablet 0    BP 154/88  Temp 98.3 F (36.8 C) (Oral)  Resp 18  SpO2 99%  LMP 05/25/2012  Physical Exam  Constitutional: She is oriented to person, place, and time. She appears well-developed and well-nourished.  HENT:  Head: Normocephalic and atraumatic.  Eyes: Conjunctivae and EOM are normal. Pupils are equal, round, and reactive to light.  Neck: Normal range of motion.  Cardiovascular: Normal rate, regular rhythm and normal heart sounds.   Pulmonary/Chest: Effort normal and breath sounds normal.  Abdominal: Soft. Bowel sounds are normal.    Musculoskeletal: Normal range of motion.  Neurological: She is alert and oriented to person, place, and time.  Skin: Skin is warm and dry.  Psychiatric: She has a normal mood and affect. Her behavior is normal.    ED Course  Procedures (including critical care time)  Labs Reviewed  URINALYSIS, ROUTINE W REFLEX MICROSCOPIC - Abnormal; Notable for the following:    APPearance HAZY (*)     All other components within normal limits  PREGNANCY, URINE  CBC WITH DIFFERENTIAL  COMPREHENSIVE METABOLIC PANEL   No results found.   No diagnosis found.    MDM  Minimal rlq and rt flank painl x 1 day.  Benign labs.  Will ct to r.o stone.  analgesia        Rosanne Ashing, MD 06/08/12  0021

## 2012-07-04 ENCOUNTER — Encounter (HOSPITAL_BASED_OUTPATIENT_CLINIC_OR_DEPARTMENT_OTHER): Payer: Self-pay | Admitting: *Deleted

## 2012-07-04 ENCOUNTER — Emergency Department (HOSPITAL_BASED_OUTPATIENT_CLINIC_OR_DEPARTMENT_OTHER)
Admission: EM | Admit: 2012-07-04 | Discharge: 2012-07-05 | Disposition: A | Discharge status: Home / Self Care | Payer: Medicaid Other | Attending: Emergency Medicine | Admitting: Emergency Medicine

## 2012-07-04 DIAGNOSIS — Z888 Allergy status to other drugs, medicaments and biological substances status: Secondary | ICD-10-CM | POA: Insufficient documentation

## 2012-07-04 DIAGNOSIS — N949 Unspecified condition associated with female genital organs and menstrual cycle: Secondary | ICD-10-CM | POA: Insufficient documentation

## 2012-07-04 DIAGNOSIS — Z7982 Long term (current) use of aspirin: Secondary | ICD-10-CM | POA: Insufficient documentation

## 2012-07-04 DIAGNOSIS — R102 Pelvic and perineal pain: Secondary | ICD-10-CM

## 2012-07-04 DIAGNOSIS — Z833 Family history of diabetes mellitus: Secondary | ICD-10-CM | POA: Insufficient documentation

## 2012-07-04 DIAGNOSIS — F17200 Nicotine dependence, unspecified, uncomplicated: Secondary | ICD-10-CM | POA: Insufficient documentation

## 2012-07-04 DIAGNOSIS — I252 Old myocardial infarction: Secondary | ICD-10-CM | POA: Insufficient documentation

## 2012-07-04 DIAGNOSIS — Z8249 Family history of ischemic heart disease and other diseases of the circulatory system: Secondary | ICD-10-CM | POA: Insufficient documentation

## 2012-07-04 LAB — URINALYSIS, ROUTINE W REFLEX MICROSCOPIC
Bilirubin Urine: NEGATIVE
Glucose, UA: NEGATIVE mg/dL
Ketones, ur: NEGATIVE mg/dL
pH: 6.5 (ref 5.0–8.0)

## 2012-07-04 NOTE — ED Notes (Signed)
Abdominal pain x 3 days. Pain is in her right mid to lower quadrant. Hx of same but no diagnosis was ever made.

## 2012-07-05 ENCOUNTER — Other Ambulatory Visit (HOSPITAL_BASED_OUTPATIENT_CLINIC_OR_DEPARTMENT_OTHER): Payer: Medicaid Other

## 2012-07-05 LAB — GC/CHLAMYDIA PROBE AMP, GENITAL
Chlamydia, DNA Probe: NEGATIVE
GC Probe Amp, Genital: NEGATIVE

## 2012-07-05 LAB — WET PREP, GENITAL: Trich, Wet Prep: NONE SEEN

## 2012-07-05 MED ORDER — HYDROCODONE-ACETAMINOPHEN 5-325 MG PO TABS: 1.0000 | ORAL_TABLET | Freq: Four times a day (QID) | ORAL | Status: AC | PRN

## 2012-07-05 MED ORDER — ONDANSETRON 8 MG PO TBDP
8.0000 mg | ORAL_TABLET | Freq: Once | ORAL | Status: AC
  Administered 2012-07-05: 8 mg via ORAL
  Filled 2012-07-05: qty 1

## 2012-07-05 MED ORDER — HYDROCODONE-ACETAMINOPHEN 5-325 MG PO TABS
2.0000 | ORAL_TABLET | Freq: Once | ORAL | Status: AC
  Administered 2012-07-05: 2 via ORAL
  Filled 2012-07-05: qty 2

## 2012-07-05 NOTE — ED Notes (Signed)
Xray tech over to talk with pt about [place] later today.  Appointment made for 4pm

## 2012-07-05 NOTE — ED Provider Notes (Signed)
History     CSN: [id number]  Arrival date & time 07/04/12  2156   First MD Initiated Contact with Patient 07/05/12 0021      Chief Complaint  Patient presents with  . Abdominal Pain    (Consider location/radiation/quality/duration/timing/severity/associated sxs/prior treatment) HPI This is a 31 year old white female with a three-day history of right suprapubic pain. The pain is in creased gradually and is now moderate to severe. It is worse with palpation or movement. It is improved with lying still. The pain is made her nauseated and vomit. She denies fever, chills, vaginal bleeding, vaginal discharge, dysuria or hematuria. She has had multiple visits for abdominal pain in the past, and has had multiple CT scans and ultrasound studies, but states this feels different and more severe.  Past Medical History  Diagnosis Date  . Acute MI     d/t carbon monoxide poisoning    Past Surgical History  Procedure Date  . Cholecystectomy     Family History  Problem Relation Age of Onset  . Coronary artery disease Other   . Hypertension Other   . Diabetes Other     History  Substance Use Topics  . Smoking status: Current Some Day Smoker    Types: Cigarettes  . Smokeless tobacco: Not on file  . Alcohol Use: No    OB History    Grav Para Term Preterm Abortions TAB SAB Ect Mult Living                  Review of Systems  All other systems reviewed and are negative.    Allergies  Naproxen  Home Medications   Current Outpatient Rx  Name Route Sig Dispense Refill  . ALBUTEROL SULFATE HFA 108 (90 BASE) MCG/ACT IN AERS Inhalation Inhale 1-2 puffs into the lungs every 6 (six) hours as needed for wheezing. 1 Inhaler 0  . ASPIRIN 81 MG PO TABS Oral Take 81 mg by mouth daily.    Marland Kitchen HYDROXYZINE PAMOATE 25 MG PO CAPS Oral Take 25 mg by mouth 2 (two) times daily.    . IBUPROFEN 200 MG PO TABS Oral Take 400 mg by mouth every 6 (six) hours as needed. For pain    . ADULT MULTIVITAMIN  W/MINERALS CH Oral Take 1 tablet by mouth daily.      BP 125/88  Pulse 80  Temp 98.3 F (36.8 C) (Oral)  Resp 20  SpO2 100%  Physical Exam General: Well-developed, well-nourished female in no acute distress; appearance consistent with age of record HENT: normocephalic, atraumatic Eyes: pupils equal round and reactive to light; extraocular muscles intact Neck: supple Heart: regular rate and rhythm; Lungs: clear to auscultation bilaterally Abdomen: soft; nondistended; right suprapubic tenderness; no masses or hepatosplenomegaly; bowel sounds present GU: Normal external genitalia; watery vaginal discharge with abnormal odor; no cervical erythema; no cervical motion tenderness; right adnexal tenderness Extremities: No deformity; full range of motion; pulses normal Neurologic: Awake, alert and oriented; motor function intact in all extremities and symmetric; no facial droop Skin: Warm and dry Psychiatric: Normal mood and affect    ED Course  Procedures (including critical care time)     MDM   Nursing notes and vitals signs, including pulse oximetry, reviewed.  Summary of this visit's results, reviewed by myself:  Labs:  Results for orders placed during the hospital encounter of 07/04/12  URINALYSIS, ROUTINE W REFLEX MICROSCOPIC      Component Value Range   Color, Urine YELLOW  YELLOW  APPearance CLEAR  CLEAR   Specific Gravity, Urine 1.022  1.005 - 1.030   pH 6.5  5.0 - 8.0   Glucose, UA NEGATIVE  NEGATIVE mg/dL   Hgb urine dipstick NEGATIVE  NEGATIVE   Bilirubin Urine NEGATIVE  NEGATIVE   Ketones, ur NEGATIVE  NEGATIVE mg/dL   Protein, ur NEGATIVE  NEGATIVE mg/dL   Urobilinogen, UA 0.2  0.0 - 1.0 mg/dL   Nitrite NEGATIVE  NEGATIVE   Leukocytes, UA NEGATIVE  NEGATIVE  PREGNANCY, URINE      Component Value Range   Preg Test, Ur NEGATIVE  NEGATIVE  WET PREP, GENITAL      Component Value Range   Yeast Wet Prep HPF POC NONE SEEN  NONE SEEN   Trich, Wet Prep NONE  SEEN  NONE SEEN   Clue Cells Wet Prep HPF POC FEW (*) NONE SEEN   WBC, Wet Prep HPF POC FEW (*) NONE SEEN   1:18 AM Location of pain and tenderness is consistent with a right ovarian cyst. We will have her return later today for pelvic ultrasound. She does have an OB/GYN with whom she can follow up.         Hanley Seamen, MD 07/05/12 [phone]

## 2013-09-06 ENCOUNTER — Emergency Department (HOSPITAL_COMMUNITY): Payer: Medicaid Other

## 2013-09-06 ENCOUNTER — Emergency Department (HOSPITAL_COMMUNITY)
Admission: EM | Admit: 2013-09-06 | Discharge: 2013-09-06 | Disposition: A | Discharge status: Home / Self Care | Payer: Medicaid Other | Attending: Emergency Medicine | Admitting: Emergency Medicine

## 2013-09-06 ENCOUNTER — Encounter (HOSPITAL_COMMUNITY): Payer: Self-pay | Admitting: Emergency Medicine

## 2013-09-06 DIAGNOSIS — F17200 Nicotine dependence, unspecified, uncomplicated: Secondary | ICD-10-CM | POA: Insufficient documentation

## 2013-09-06 DIAGNOSIS — R109 Unspecified abdominal pain: Secondary | ICD-10-CM | POA: Insufficient documentation

## 2013-09-06 DIAGNOSIS — G8929 Other chronic pain: Secondary | ICD-10-CM | POA: Insufficient documentation

## 2013-09-06 DIAGNOSIS — Z3202 Encounter for pregnancy test, result negative: Secondary | ICD-10-CM | POA: Insufficient documentation

## 2013-09-06 DIAGNOSIS — I252 Old myocardial infarction: Secondary | ICD-10-CM | POA: Insufficient documentation

## 2013-09-06 HISTORY — DX: Other chronic pain: G89.29

## 2013-09-06 HISTORY — DX: Right lower quadrant pain: R10.31

## 2013-09-06 LAB — COMPREHENSIVE METABOLIC PANEL
AST: 13 U/L (ref 0–37)
Albumin: 4 g/dL (ref 3.5–5.2)
Calcium: 10.1 mg/dL (ref 8.4–10.5)
Chloride: 103 mEq/L (ref 96–112)
Creatinine, Ser: 0.75 mg/dL (ref 0.50–1.10)
Total Bilirubin: 0.4 mg/dL (ref 0.3–1.2)
Total Protein: 7.8 g/dL (ref 6.0–8.3)

## 2013-09-06 LAB — URINALYSIS, ROUTINE W REFLEX MICROSCOPIC
Glucose, UA: NEGATIVE mg/dL
Specific Gravity, Urine: 1.015 (ref 1.005–1.030)
pH: 8.5 — ABNORMAL HIGH (ref 5.0–8.0)

## 2013-09-06 LAB — CBC WITH DIFFERENTIAL/PLATELET
Basophils Absolute: 0 10*3/uL (ref 0.0–0.1)
Basophils Relative: 0 % (ref 0–1)
HCT: 42.1 % (ref 36.0–46.0)
MCHC: 34 g/dL (ref 30.0–36.0)
Monocytes Absolute: 0.4 10*3/uL (ref 0.1–1.0)
Neutro Abs: 5.4 10*3/uL (ref 1.7–7.7)
RDW: 13.2 % (ref 11.5–15.5)

## 2013-09-06 LAB — URINE MICROSCOPIC-ADD ON

## 2013-09-06 LAB — POCT PREGNANCY, URINE: Preg Test, Ur: NEGATIVE

## 2013-09-06 MED ORDER — ONDANSETRON HCL 4 MG/2ML IJ SOLN
4.0000 mg | Freq: Once | INTRAMUSCULAR | Status: AC
  Administered 2013-09-06: 4 mg via INTRAVENOUS
  Filled 2013-09-06: qty 2

## 2013-09-06 MED ORDER — HYDROCODONE-ACETAMINOPHEN 5-325 MG PO TABS: 1.0000 | ORAL_TABLET | Freq: Four times a day (QID) | ORAL | Status: DC | PRN

## 2013-09-06 MED ORDER — HYDROMORPHONE HCL PF 1 MG/ML IJ SOLN
1.0000 mg | Freq: Once | INTRAMUSCULAR | Status: AC
  Administered 2013-09-06: 1 mg via INTRAVENOUS
  Filled 2013-09-06: qty 1

## 2013-09-06 MED ORDER — IOHEXOL 300 MG/ML  SOLN
50.0000 mL | Freq: Once | INTRAMUSCULAR | Status: AC | PRN
  Administered 2013-09-06: 50 mL via ORAL

## 2013-09-06 MED ORDER — ONDANSETRON 4 MG PO TBDP: ORAL_TABLET | ORAL | Status: DC

## 2013-09-06 MED ORDER — SODIUM CHLORIDE 0.9 % IV BOLUS (SEPSIS)
1000.0000 mL | Freq: Once | INTRAVENOUS | Status: AC
  Administered 2013-09-06: 1000 mL via INTRAVENOUS

## 2013-09-06 MED ORDER — IOHEXOL 300 MG/ML  SOLN
100.0000 mL | Freq: Once | INTRAMUSCULAR | Status: AC | PRN
  Administered 2013-09-06: 100 mL via INTRAVENOUS

## 2013-09-06 MED ORDER — OXYCODONE-ACETAMINOPHEN 5-325 MG PO TABS
2.0000 | ORAL_TABLET | Freq: Once | ORAL | Status: AC
  Administered 2013-09-06: 2 via ORAL
  Filled 2013-09-06: qty 2

## 2013-09-06 MED ORDER — IOHEXOL 300 MG/ML  SOLN: 50.0000 mL | Freq: Once | INTRAMUSCULAR | Status: DC | PRN

## 2013-09-06 NOTE — ED Provider Notes (Signed)
CSN: [id number]     Arrival date & time 09/06/13  1358 History   First MD Initiated Contact with Patient 09/06/13 1412     Chief Complaint  Patient presents with  . Abdominal Pain   (Consider location/radiation/quality/duration/timing/severity/associated sxs/prior Treatment) Patient is a 32 y.o. female presenting with abdominal pain. The history is provided by the patient (the pt complains of abdominal pain). No language interpreter was used.  Abdominal Pain Pain location:  Suprapubic Pain quality: aching   Pain radiates to:  Does not radiate Pain severity:  Moderate Onset quality:  Gradual Timing:  Constant Chronicity:  New Associated symptoms: no chest pain, no cough, no diarrhea, no fatigue and no hematuria     Past Medical History  Diagnosis Date  . Acute MI     d/t carbon monoxide poisoning  . Abdominal pain, chronic, right lower quadrant    Past Surgical History  Procedure Laterality Date  . Cholecystectomy     Family History  Problem Relation Age of Onset  . Coronary artery disease Other   . Hypertension Other   . Diabetes Other    History  Substance Use Topics  . Smoking status: Current Some Day Smoker    Types: Cigarettes  . Smokeless tobacco: Not on file  . Alcohol Use: No   OB History   Grav Para Term Preterm Abortions TAB SAB Ect Mult Living                 Review of Systems  Constitutional: Negative for appetite change and fatigue.  HENT: Negative for congestion, ear discharge and sinus pressure.   Eyes: Negative for discharge.  Respiratory: Negative for cough.   Cardiovascular: Negative for chest pain.  Gastrointestinal: Positive for abdominal pain. Negative for diarrhea.  Genitourinary: Negative for frequency and hematuria.  Musculoskeletal: Negative for back pain.  Skin: Negative for rash.  Neurological: Negative for seizures and headaches.  Psychiatric/Behavioral: Negative for hallucinations.    Allergies  Naproxen  Home Medications    Current Outpatient Rx  Name  Route  Sig  Dispense  Refill  . ibuprofen (ADVIL,MOTRIN) 200 MG tablet   Oral   Take 600 mg by mouth every 6 (six) hours as needed. For pain         . HYDROcodone-acetaminophen (NORCO/VICODIN) 5-325 MG per tablet   Oral   Take 1 tablet by mouth every 6 (six) hours as needed for moderate pain.   20 tablet   0   . ondansetron (ZOFRAN ODT) 4 MG disintegrating tablet      4mg  ODT q4 hours prn nausea/vomit   12 tablet   0    BP 122/83  Pulse 65  Temp(Src) 99.1 F (37.3 C) (Oral)  Resp 20  Ht 5\' 7"  (1.702 m)  Wt 205 lb (92.987 kg)  BMI 32.10 kg/m2  SpO2 100%  LMP 07/23/2013 Physical Exam  Constitutional: She is oriented to person, place, and time. She appears well-developed.  HENT:  Head: Normocephalic.  Eyes: Conjunctivae and EOM are normal. No scleral icterus.  Neck: Neck supple. No thyromegaly present.  Cardiovascular: Normal rate and regular rhythm.  Exam reveals no gallop and no friction rub.   No murmur heard. Pulmonary/Chest: No stridor. She has no wheezes. She has no rales. She exhibits no tenderness.  Abdominal: She exhibits no distension. There is tenderness. There is no rebound.  Moderate rlq tender no rebound  Genitourinary:  Tender right adenexa.   Non tender cervix.  No blood  Musculoskeletal:  Normal range of motion. She exhibits no edema.  Lymphadenopathy:    She has no cervical adenopathy.  Neurological: She is oriented to person, place, and time. She exhibits normal muscle tone. Coordination normal.  Skin: No rash noted. No erythema.  Psychiatric: She has a normal mood and affect. Her behavior is normal.    ED Course  Procedures (including critical care time) Labs Review Labs Reviewed  URINALYSIS, ROUTINE W REFLEX MICROSCOPIC - Abnormal; Notable for the following:    pH 8.5 (*)    Hgb urine dipstick TRACE (*)    All other components within normal limits  URINE MICROSCOPIC-ADD ON - Abnormal; Notable for the  following:    Squamous Epithelial / LPF FEW (*)    All other components within normal limits  CBC WITH DIFFERENTIAL  COMPREHENSIVE METABOLIC PANEL  POCT PREGNANCY, URINE   Imaging Review No results found.  EKG Interpretation   None       MDM   1. Abdominal pain       Benny Lennert, MD 09/09/13 1116

## 2013-09-06 NOTE — ED Notes (Signed)
Pt c/o RLQ pain as well as N/V/D.

## 2013-09-06 NOTE — ED Provider Notes (Signed)
Received pt at change of shift. 32yo F, c/o RLQ and suprapubic abd pain for the past several days. Has been associated with N/V/D.  The symptoms have been associated with no other complaints. The patient has a significant history of similar symptoms previously and multiple prior ED evals for same. VSS. Has tol PO well while in the ED without N/V. No stooling while in the ED. Workup reassuring. Pt feels better and wants to go home now. Dx and testing d/w pt.  Questions answered.  Verb understanding, agreeable to d/c home with outpt f/u.    Results for orders placed during the hospital encounter of 09/06/13  CBC WITH DIFFERENTIAL      Result Value Range   WBC 8.1  4.0 - 10.5 K/uL   RBC 4.47  3.87 - 5.11 MIL/uL   Hemoglobin 14.3  12.0 - 15.0 g/dL   HCT 82.9  56.2 - 13.0 %   MCV 94.2  78.0 - 100.0 fL   MCH 32.0  26.0 - 34.0 pg   MCHC 34.0  30.0 - 36.0 g/dL   RDW 86.5  78.4 - 69.6 %   Platelets 252  150 - 400 K/uL   Neutrophils Relative % 67  43 - 77 %   Neutro Abs 5.4  1.7 - 7.7 K/uL   Lymphocytes Relative 26  12 - 46 %   Lymphs Abs 2.1  0.7 - 4.0 K/uL   Monocytes Relative 5  3 - 12 %   Monocytes Absolute 0.4  0.1 - 1.0 K/uL   Eosinophils Relative 1  0 - 5 %   Eosinophils Absolute 0.1  0.0 - 0.7 K/uL   Basophils Relative 0  0 - 1 %   Basophils Absolute 0.0  0.0 - 0.1 K/uL  COMPREHENSIVE METABOLIC PANEL      Result Value Range   Sodium 138  135 - 145 mEq/L   Potassium 3.8  3.5 - 5.1 mEq/L   Chloride 103  96 - 112 mEq/L   CO2 22  19 - 32 mEq/L   Glucose, Bld 95  70 - 99 mg/dL   BUN 9  6 - 23 mg/dL   Creatinine, Ser 2.95  0.50 - 1.10 mg/dL   Calcium 28.4  8.4 - 13.2 mg/dL   Total Protein 7.8  6.0 - 8.3 g/dL   Albumin 4.0  3.5 - 5.2 g/dL   AST 13  0 - 37 U/L   ALT 9  0 - 35 U/L   Alkaline Phosphatase 87  39 - 117 U/L   Total Bilirubin 0.4  0.3 - 1.2 mg/dL   GFR calc non Af Amer >90  >90 mL/min   GFR calc Af Amer >90  >90 mL/min  URINALYSIS, ROUTINE W REFLEX MICROSCOPIC   Result Value Range   Color, Urine YELLOW  YELLOW   APPearance CLEAR  CLEAR   Specific Gravity, Urine 1.015  1.005 - 1.030   pH 8.5 (*) 5.0 - 8.0   Glucose, UA NEGATIVE  NEGATIVE mg/dL   Hgb urine dipstick TRACE (*) NEGATIVE   Bilirubin Urine NEGATIVE  NEGATIVE   Ketones, ur NEGATIVE  NEGATIVE mg/dL   Protein, ur NEGATIVE  NEGATIVE mg/dL   Urobilinogen, UA 0.2  0.0 - 1.0 mg/dL   Nitrite NEGATIVE  NEGATIVE   Leukocytes, UA NEGATIVE  NEGATIVE  URINE MICROSCOPIC-ADD ON      Result Value Range   Squamous Epithelial / LPF FEW (*) RARE   WBC, UA 0-2  <3  WBC/hpf   RBC / HPF 0-2  <3 RBC/hpf   Bacteria, UA RARE  RARE  POCT PREGNANCY, URINE      Result Value Range   Preg Test, Ur NEGATIVE  NEGATIVE   Ct Abdomen Pelvis W Contrast 09/06/2013   CLINICAL DATA:  Three day history of right suprapubic pain.  EXAM: CT ABDOMEN AND PELVIS WITH CONTRAST  TECHNIQUE: Multidetector CT imaging of the abdomen and pelvis was performed using the standard protocol following bolus administration of intravenous contrast.  CONTRAST:  50mL OMNIPAQUE IOHEXOL 300 MG/ML SOLN, OMNIPAQUE IOHEXOL 300 MG/ML SOLN  COMPARISON:  07/28/2013.  FINDINGS: The lung bases are clear.  Again noted is a small 7 mm hypodensity at the dome of the liver which is too small to characterize. This may represent a small cyst or hamartoma. There is no intrahepatic or extrahepatic biliary ductal dilatation. The gallbladder is surgically absent. The spleen demonstrates no focal abnormality. The kidneys, adrenal glands and pancreas are normal. The bladder is unremarkable.  The stomach, duodenum, small intestine, and large intestine demonstrate no contrast extravasation or dilatation. There is a small fat containing supraumbilical hernia. There is a normal caliber appendix in the right lower quadrant without periappendiceal inflammatory changes. There is no pneumoperitoneum, pneumatosis, or portal venous gas. There is no abdominal or pelvic free  fluid. There is no lymphadenopathy.  The abdominal aorta is normal in caliber .  There are no lytic or sclerotic osseous lesions.  IMPRESSION: 1.  No acute abdominal or pelvic pathology.  2.  Small fat containing supraumbilical hernia.   Electronically Signed   By: Elige Ko   On: 09/06/2013 16:56       Laray Anger, DO 09/06/13 1732

## 2013-09-06 NOTE — ED Notes (Signed)
Abdominal pain with nausea 

## 2013-09-09 ENCOUNTER — Emergency Department (HOSPITAL_COMMUNITY)
Admission: EM | Admit: 2013-09-09 | Discharge: 2013-09-09 | Disposition: A | Discharge status: Home / Self Care | Payer: Medicaid Other | Attending: Emergency Medicine | Admitting: Emergency Medicine

## 2013-09-09 ENCOUNTER — Encounter (HOSPITAL_COMMUNITY): Payer: Self-pay | Admitting: Emergency Medicine

## 2013-09-09 DIAGNOSIS — G8929 Other chronic pain: Secondary | ICD-10-CM | POA: Insufficient documentation

## 2013-09-09 DIAGNOSIS — F17200 Nicotine dependence, unspecified, uncomplicated: Secondary | ICD-10-CM | POA: Insufficient documentation

## 2013-09-09 DIAGNOSIS — R1013 Epigastric pain: Secondary | ICD-10-CM | POA: Insufficient documentation

## 2013-09-09 DIAGNOSIS — Y939 Activity, unspecified: Secondary | ICD-10-CM | POA: Insufficient documentation

## 2013-09-09 DIAGNOSIS — R109 Unspecified abdominal pain: Secondary | ICD-10-CM

## 2013-09-09 DIAGNOSIS — Z3202 Encounter for pregnancy test, result negative: Secondary | ICD-10-CM | POA: Insufficient documentation

## 2013-09-09 DIAGNOSIS — I252 Old myocardial infarction: Secondary | ICD-10-CM | POA: Insufficient documentation

## 2013-09-09 DIAGNOSIS — R6883 Chills (without fever): Secondary | ICD-10-CM | POA: Insufficient documentation

## 2013-09-09 DIAGNOSIS — R197 Diarrhea, unspecified: Secondary | ICD-10-CM | POA: Insufficient documentation

## 2013-09-09 DIAGNOSIS — S060X9A Concussion with loss of consciousness of unspecified duration, initial encounter: Secondary | ICD-10-CM | POA: Insufficient documentation

## 2013-09-09 DIAGNOSIS — IMO0002 Reserved for concepts with insufficient information to code with codable children: Secondary | ICD-10-CM | POA: Insufficient documentation

## 2013-09-09 DIAGNOSIS — R1084 Generalized abdominal pain: Secondary | ICD-10-CM | POA: Insufficient documentation

## 2013-09-09 DIAGNOSIS — R112 Nausea with vomiting, unspecified: Secondary | ICD-10-CM | POA: Insufficient documentation

## 2013-09-09 DIAGNOSIS — Y929 Unspecified place or not applicable: Secondary | ICD-10-CM | POA: Insufficient documentation

## 2013-09-09 DIAGNOSIS — R61 Generalized hyperhidrosis: Secondary | ICD-10-CM | POA: Insufficient documentation

## 2013-09-09 LAB — CBC WITH DIFFERENTIAL/PLATELET
Eosinophils Relative: 1 % (ref 0–5)
HCT: 42.4 % (ref 36.0–46.0)
Lymphocytes Relative: 25 % (ref 12–46)
Lymphs Abs: 2.5 10*3/uL (ref 0.7–4.0)
MCH: 31.9 pg (ref 26.0–34.0)
MCHC: 33.7 g/dL (ref 30.0–36.0)
MCV: 94.6 fL (ref 78.0–100.0)
Monocytes Absolute: 0.5 10*3/uL (ref 0.1–1.0)
RDW: 13.4 % (ref 11.5–15.5)
WBC: 10.2 10*3/uL (ref 4.0–10.5)

## 2013-09-09 LAB — URINE MICROSCOPIC-ADD ON

## 2013-09-09 LAB — URINALYSIS, ROUTINE W REFLEX MICROSCOPIC
Glucose, UA: NEGATIVE mg/dL
Urobilinogen, UA: 0.2 mg/dL (ref 0.0–1.0)
pH: 6.5 (ref 5.0–8.0)

## 2013-09-09 LAB — BASIC METABOLIC PANEL
CO2: 23 mEq/L (ref 19–32)
Calcium: 9.5 mg/dL (ref 8.4–10.5)
Creatinine, Ser: 0.73 mg/dL (ref 0.50–1.10)
Glucose, Bld: 97 mg/dL (ref 70–99)

## 2013-09-09 MED ORDER — HYDROMORPHONE HCL PF 1 MG/ML IJ SOLN
1.0000 mg | Freq: Once | INTRAMUSCULAR | Status: AC
  Administered 2013-09-09: 1 mg via INTRAVENOUS
  Filled 2013-09-09: qty 1

## 2013-09-09 MED ORDER — DICYCLOMINE HCL 20 MG PO TABS: 20.0000 mg | ORAL_TABLET | Freq: Two times a day (BID) | ORAL | Status: DC

## 2013-09-09 MED ORDER — ONDANSETRON HCL 4 MG/2ML IJ SOLN
4.0000 mg | Freq: Once | INTRAMUSCULAR | Status: AC
  Administered 2013-09-09: 4 mg via INTRAVENOUS
  Filled 2013-09-09: qty 2

## 2013-09-09 MED ORDER — SODIUM CHLORIDE 0.9 % IV BOLUS (SEPSIS)
1000.0000 mL | Freq: Once | INTRAVENOUS | Status: AC
  Administered 2013-09-09: 1000 mL via INTRAVENOUS

## 2013-09-09 MED ORDER — RANITIDINE HCL 150 MG PO TABS: 150.0000 mg | ORAL_TABLET | Freq: Two times a day (BID) | ORAL | Status: DC

## 2013-09-09 MED ORDER — MORPHINE SULFATE 4 MG/ML IJ SOLN
4.0000 mg | Freq: Once | INTRAMUSCULAR | Status: AC
  Administered 2013-09-09: 4 mg via INTRAVENOUS
  Filled 2013-09-09: qty 1

## 2013-09-09 NOTE — ED Provider Notes (Addendum)
CSN: [id number]     Arrival date & time 09/09/13  1803 History  This chart was scribed for Shannon Sullivan, * by Ronal Fear, ED Scribe. This patient was seen in room APA15/APA15 and the patient's care was started at 6:16 PM.    Chief Complaint  Patient presents with  . Flank Pain  . Loss of Consciousness   (Consider location/radiation/quality/duration/timing/severity/associated sxs/prior Treatment) HPI  HPI Comments: Shannon Sullivan is a 32 y.o. female who presents to the Emergency Department complaining of worsening flank pain that has migrated to her epigastric region. Pt states that she cannot keep anything down. If she does eat she will either vomit or have diarrhea. She states that she hit her head when she lost consciousness.      Past Medical History  Diagnosis Date  . Acute MI     d/t carbon monoxide poisoning  . Abdominal pain, chronic, right lower quadrant    Past Surgical History  Procedure Laterality Date  . Cholecystectomy     Family History  Problem Relation Age of Onset  . Coronary artery disease Other   . Hypertension Other   . Diabetes Other    History  Substance Use Topics  . Smoking status: Current Some Day Smoker    Types: Cigarettes  . Smokeless tobacco: Not on file  . Alcohol Use: No   OB History   Grav Para Term Preterm Abortions TAB SAB Ect Mult Living                 Review of Systems  Constitutional: Positive for chills and diaphoresis. Negative for fever.  HENT: Negative for congestion and sore throat.   Respiratory: Negative for cough and shortness of breath.   Gastrointestinal: Positive for nausea, vomiting, abdominal pain and diarrhea.  Neurological: Positive for syncope and headaches.  All other systems reviewed and are negative.    Allergies  Naproxen  Home Medications   Current Outpatient Rx  Name  Route  Sig  Dispense  Refill  . HYDROcodone-acetaminophen (NORCO/VICODIN) 5-325 MG per tablet   Oral   Take 1 tablet  by mouth every 6 (six) hours as needed for moderate pain.   20 tablet   0   . ibuprofen (ADVIL,MOTRIN) 200 MG tablet   Oral   Take 600 mg by mouth every 6 (six) hours as needed. For pain         . ondansetron (ZOFRAN ODT) 4 MG disintegrating tablet      4mg  ODT q4 hours prn nausea/vomit   12 tablet   0    BP 114/78  Pulse 88  Temp(Src) 97.6 F (36.4 C) (Oral)  Resp 22  Ht 5\' 7"  (1.702 m)  Wt 206 lb (93.441 kg)  BMI 32.26 kg/m2  SpO2 100%  LMP 09/08/2013 Physical Exam  Nursing note and vitals reviewed. Constitutional: She is oriented to person, place, and time. She appears well-developed and well-nourished. No distress.  HENT:  Head: Normocephalic and atraumatic.  Right Ear: Hearing normal.  Left Ear: Hearing normal.  Nose: Nose normal.  Mouth/Throat: Oropharynx is clear and moist and mucous membranes are normal.  Eyes: Conjunctivae and EOM are normal. Pupils are equal, round, and reactive to light.  Neck: Normal range of motion. Neck supple.  Cardiovascular: Regular rhythm, S1 normal and S2 normal.  Exam reveals no gallop and no friction rub.   No murmur heard. Pulmonary/Chest: Effort normal and breath sounds normal. No respiratory distress. She exhibits no tenderness.  Abdominal:  Soft. Normal appearance and bowel sounds are normal. There is no hepatosplenomegaly. There is tenderness (diffusely ). There is no rebound, no guarding, no tenderness at McBurney's point and negative Murphy's sign. No hernia.  Musculoskeletal: Normal range of motion.  Neurological: She is alert and oriented to person, place, and time. She has normal strength. No cranial nerve deficit or sensory deficit. Coordination normal. GCS eye subscore is 4. GCS verbal subscore is 5. GCS motor subscore is 6.  Skin: Skin is warm, dry and intact. No rash noted. No cyanosis.  Psychiatric: She has a normal mood and affect. Her speech is normal and behavior is normal. Thought content normal.    ED Course   Procedures (including critical care time) DIAGNOSTIC STUDIES: Oxygen Saturation is 100% on RA, normal by my interpretation.    COORDINATION OF CARE:    6:19 PM- Pt advised of plan for treatment including a pelvic exam and pt agrees.  Labs Review Labs Reviewed  URINALYSIS, ROUTINE W REFLEX MICROSCOPIC - Abnormal; Notable for the following:    Color, Urine AMBER (*)    APPearance CLOUDY (*)    Hgb urine dipstick LARGE (*)    Bilirubin Urine SMALL (*)    Ketones, ur TRACE (*)    All other components within normal limits  URINE MICROSCOPIC-ADD ON - Abnormal; Notable for the following:    Bacteria, UA MANY (*)    All other components within normal limits  CBC WITH DIFFERENTIAL  BASIC METABOLIC PANEL  PREGNANCY, URINE   Imaging Review No results found.  EKG Interpretation   None       MDM  Diagnosis: Abdominal pain  Patient presents to the ER for evaluation abdominal pain. Patient was seen several days ago for similar pain. At that time the pain was mainly suprapubic. Her suprapubic pain is still there, now she is having pain in the epigastric and right side of her abdomen in the mid abdominal region. Exam is significant diffuse tenderness without guarding or rebound. No concern for acute surgical process. Patient's labs remained unremarkable. Reviewing her records reveal she had a CAT scan performed at her previous visit which was unremarkable. The combination of diffuse, nonfocal abdominal pain with nausea, vomiting and diarrhea is likely viral in nature. Patient hydrated in the ER and will be discharged with symptomatic treatment.  Postop the development of pain in the upper abdomen, specifically on the right, we will schedule outpatient ultrasound. CAT scan did not show any obvious gallstones, but can be missed.  I personally performed the services described in this documentation, which was scribed in my presence. The recorded information has been reviewed and is  accurate.    Shannon Crease, MD 09/09/13 2009  Shannon Crease, MD 09/09/13 2019

## 2013-09-09 NOTE — ED Notes (Signed)
Pt reports no change in pain level.  Reinforced with pt that it's too soon for additional pain medication as she received morphine less than 30 min ago.

## 2013-09-09 NOTE — ED Notes (Signed)
Pt with right sided abd pain for 5 days, + N/V/D, pt states that she passed out earlier today at home, c/o dizziness

## 2013-09-10 ENCOUNTER — Other Ambulatory Visit (HOSPITAL_COMMUNITY): Payer: Self-pay | Admitting: Emergency Medicine

## 2013-09-10 ENCOUNTER — Ambulatory Visit (HOSPITAL_COMMUNITY): Admit: 2013-09-10 | Payer: Medicaid Other

## 2013-09-10 DIAGNOSIS — R1011 Right upper quadrant pain: Secondary | ICD-10-CM

## 2013-09-30 ENCOUNTER — Emergency Department (HOSPITAL_COMMUNITY)
Admission: EM | Admit: 2013-09-30 | Discharge: 2013-09-30 | Disposition: A | Discharge status: Home / Self Care | Payer: Self-pay | Attending: Emergency Medicine | Admitting: Emergency Medicine

## 2013-09-30 ENCOUNTER — Encounter (HOSPITAL_COMMUNITY): Payer: Self-pay | Admitting: Emergency Medicine

## 2013-09-30 ENCOUNTER — Emergency Department (HOSPITAL_COMMUNITY): Payer: Self-pay

## 2013-09-30 DIAGNOSIS — I252 Old myocardial infarction: Secondary | ICD-10-CM | POA: Insufficient documentation

## 2013-09-30 DIAGNOSIS — H53149 Visual discomfort, unspecified: Secondary | ICD-10-CM | POA: Insufficient documentation

## 2013-09-30 DIAGNOSIS — Z79899 Other long term (current) drug therapy: Secondary | ICD-10-CM | POA: Insufficient documentation

## 2013-09-30 DIAGNOSIS — R112 Nausea with vomiting, unspecified: Secondary | ICD-10-CM | POA: Insufficient documentation

## 2013-09-30 DIAGNOSIS — F17200 Nicotine dependence, unspecified, uncomplicated: Secondary | ICD-10-CM | POA: Insufficient documentation

## 2013-09-30 DIAGNOSIS — G8929 Other chronic pain: Secondary | ICD-10-CM | POA: Insufficient documentation

## 2013-09-30 DIAGNOSIS — R51 Headache: Secondary | ICD-10-CM | POA: Insufficient documentation

## 2013-09-30 LAB — URINALYSIS, ROUTINE W REFLEX MICROSCOPIC
Bilirubin Urine: NEGATIVE
Leukocytes, UA: NEGATIVE
Nitrite: NEGATIVE
Specific Gravity, Urine: >1.03 — ABNORMAL HIGH (ref 1.005–1.030)
Urobilinogen, UA: 0.2 mg/dL (ref 0.0–1.0)
pH: 6 (ref 5.0–8.0)

## 2013-09-30 LAB — URINE MICROSCOPIC-ADD ON

## 2013-09-30 MED ORDER — MORPHINE SULFATE 4 MG/ML IJ SOLN
4.0000 mg | Freq: Once | INTRAMUSCULAR | Status: AC
  Administered 2013-09-30: 4 mg via INTRAVENOUS
  Filled 2013-09-30: qty 1

## 2013-09-30 MED ORDER — PROCHLORPERAZINE EDISYLATE 5 MG/ML IJ SOLN
10.0000 mg | Freq: Once | INTRAMUSCULAR | Status: AC
  Administered 2013-09-30: 10 mg via INTRAVENOUS
  Filled 2013-09-30: qty 2

## 2013-09-30 MED ORDER — SODIUM CHLORIDE 0.9 % IV BOLUS (SEPSIS)
1000.0000 mL | Freq: Once | INTRAVENOUS | Status: AC
  Administered 2013-09-30: 1000 mL via INTRAVENOUS

## 2013-09-30 MED ORDER — KETOROLAC TROMETHAMINE 30 MG/ML IJ SOLN
30.0000 mg | Freq: Once | INTRAMUSCULAR | Status: AC
Start: 2013-09-30 — End: 2013-09-30
  Administered 2013-09-30: 30 mg via INTRAVENOUS
  Filled 2013-09-30: qty 1

## 2013-09-30 MED ORDER — PROMETHAZINE HCL 25 MG PO TABS: 25.0000 mg | ORAL_TABLET | Freq: Four times a day (QID) | ORAL | Status: AC | PRN

## 2013-09-30 MED ORDER — SUMATRIPTAN SUCCINATE 25 MG PO TABS: 50.0000 mg | ORAL_TABLET | ORAL | Status: DC | PRN

## 2013-09-30 NOTE — ED Notes (Signed)
Pt c/o headache with vomiting x 5 days.

## 2013-09-30 NOTE — ED Provider Notes (Signed)
CSN: [id number]     Arrival date & time 09/30/13  4098 History   First MD Initiated Contact with Patient 09/30/13 0354     Chief Complaint  Patient presents with  . Headache  . Emesis   (Consider location/radiation/quality/duration/timing/severity/associated sxs/prior Treatment) HPI  The patient is a pleasant 32 year old female who has no active medical problems who complains of a headache. This headache is described as a squeezing pain which is frontal in location, started 5 days ago and was gradual in onset at that time. It has been relatively persistent though it does wax and wane in intensity, is associated with photophobia and states that yesterday she developed nausea. The nausea has been persistent as well and today has developed multiple episodes of vomiting. She denies any abdominal discomfort, chest discomfort, back discomfort but did have some dysuria several days ago. She has been able to walk without difficulty and has no numbness or weakness of her extremities, no difficulty with speaking, speech. She does have some intermittent blurry vision with her headache. She has tried to take over-the-counter medications and butalbital without relief. She denies having frequent headaches though she does have a history of tension headaches a long time ago. She denies head injuries, has not had any carbon monoxide exposures and has no other family members in the house with headaches.  Past Medical History  Diagnosis Date  . Acute MI     d/t carbon monoxide poisoning  . Abdominal pain, chronic, right lower quadrant    Past Surgical History  Procedure Laterality Date  . Cholecystectomy     Family History  Problem Relation Age of Onset  . Coronary artery disease Other   . Hypertension Other   . Diabetes Other    History  Substance Use Topics  . Smoking status: Current Some Day Smoker    Types: Cigarettes  . Smokeless tobacco: Not on file  . Alcohol Use: No   OB History   Grav Para  Term Preterm Abortions TAB SAB Ect Mult Living                 Review of Systems  All other systems reviewed and are negative.    Allergies  Naproxen  Home Medications   Current Outpatient Rx  Name  Route  Sig  Dispense  Refill  . ibuprofen (ADVIL,MOTRIN) 200 MG tablet   Oral   Take 600 mg by mouth every 6 (six) hours as needed. For pain         . loperamide (IMODIUM) 2 MG capsule   Oral   Take 2 mg by mouth as needed for diarrhea or loose stools.         . ondansetron (ZOFRAN-ODT) 4 MG disintegrating tablet   Oral   Take 4 mg by mouth every 4 (four) hours as needed for nausea or vomiting.         . promethazine (PHENERGAN) 25 MG tablet   Oral   Take 25 mg by mouth every 6 (six) hours as needed for nausea or vomiting.         . ranitidine (ZANTAC) 150 MG tablet   Oral   Take 1 tablet (150 mg total) by mouth 2 (two) times daily.   60 tablet   0   . dicyclomine (BENTYL) 20 MG tablet   Oral   Take 1 tablet (20 mg total) by mouth 2 (two) times daily.   20 tablet   0   . HYDROcodone-acetaminophen (NORCO/VICODIN) 5-325  MG per tablet   Oral   Take 1 tablet by mouth every 6 (six) hours as needed for moderate pain.   20 tablet   0   . promethazine (PHENERGAN) 25 MG tablet   Oral   Take 1 tablet (25 mg total) by mouth every 6 (six) hours as needed for nausea or vomiting.   12 tablet   0   . SUMAtriptan (IMITREX) 25 MG tablet   Oral   Take 2 tablets (50 mg total) by mouth every 2 (two) hours as needed for migraine (ongoing headache). Maximum daily dose 200mg    30 tablet   0    BP 110/66  Pulse 61  Temp(Src) 97.6 F (36.4 C) (Oral)  Resp 20  Ht 5\' 7"  (1.702 m)  Wt 180 lb (81.647 kg)  BMI 28.19 kg/m2  SpO2 97%  LMP 09/08/2013 Physical Exam  Nursing note and vitals reviewed. Constitutional: She appears well-developed and well-nourished.  Uncomfortable appearing, keeping her eyes covered with clothing garment  HENT:  Head: Normocephalic and  atraumatic.  Mouth/Throat: Oropharynx is clear and moist. No oropharyngeal exudate.  Eyes: Conjunctivae and EOM are normal. Pupils are equal, round, and reactive to light. Right eye exhibits no discharge. Left eye exhibits no discharge. No scleral icterus.  Neck: Normal range of motion. Neck supple. No JVD present. No thyromegaly present.  Very supple neck, no lymphadenopathy, full range of motion without difficulty, no torticollis or trismus  Cardiovascular: Normal rate, regular rhythm, normal heart sounds and intact distal pulses.  Exam reveals no gallop and no friction rub.   No murmur heard. Pulmonary/Chest: Effort normal and breath sounds normal. No respiratory distress. She has no wheezes. She has no rales.  Abdominal: Soft. Bowel sounds are normal. She exhibits no distension and no mass. There is tenderness ( Minimal suprapubic tenderness, no guarding, no masses, no peritoneal signs).  Musculoskeletal: Normal range of motion. She exhibits no edema and no tenderness.  Lymphadenopathy:    She has no cervical adenopathy.  Neurological: She is alert. Coordination normal.  Speech is clear, cranial nerves III through XII are intact, memory is intact, strength is normal in all 4 extremities including grips, sensation is intact to light touch and pinprick in all 4 extremities. Coordination as tested by finger-nose-finger is normal, no limb ataxia.  Skin: Skin is warm and dry. No rash noted. No erythema.  Psychiatric: She has a normal mood and affect. Her behavior is normal.    ED Course  Procedures (including critical care time) Labs Review Labs Reviewed  URINALYSIS, ROUTINE W REFLEX MICROSCOPIC - Abnormal; Notable for the following:    Specific Gravity, Urine >1.030 (*)    Hgb urine dipstick TRACE (*)    All other components within normal limits  URINE MICROSCOPIC-ADD ON - Abnormal; Notable for the following:    Squamous Epithelial / LPF FEW (*)    Bacteria, UA FEW (*)    All other  components within normal limits   Imaging Review Ct Head Wo Contrast  09/30/2013   CLINICAL DATA:  Severe headache and emesis.  EXAM: CT HEAD WITHOUT CONTRAST  TECHNIQUE: Contiguous axial images were obtained from the base of the skull through the vertex without intravenous contrast.  COMPARISON:  CT of the head November 02, 2009  FINDINGS: The ventricles and sulci are normal. No intraparenchymal hemorrhage, mass effect nor midline shift. No acute large vascular territory infarcts.  No abnormal extra-axial fluid collections. Basal cisterns are patent. Dense calcification along the  anterior falx again noted.  No skull fracture. Visualized paranasal sinuses and mastoid air-cells are well-aerated, improved. The included ocular globes and orbital contents are non-suspicious.  IMPRESSION: No acute intracranial process.   Electronically Signed   By: Awilda Metro   On: 09/30/2013 06:43    EKG Interpretation   None       MDM   1. Headache    The patient has a headache, she has no focal neurologic signs, no fever, no stiff neck. She does have associated photophobia and nausea. This does appear to be a primary headache and thus will be treated with IV fluids, IV pain medications. Will also check urinalysis to evaluate for the source of isolated episode of dysuria as well as to gauge dehydration.  CT scan of the head is negative, the patient has improved significantly with Toradol and Compazine. Tolerating orals, stable for discharge, see medications below. Patient agrees to followup as outpatient the  Meds given in ED:  Medications  sodium chloride 0.9 % bolus 1,000 mL (0 mLs Intravenous Stopped 09/30/13 0510)  prochlorperazine (COMPAZINE) injection 10 mg (10 mg Intravenous Given 09/30/13 0428)  morphine 4 MG/ML injection 4 mg (4 mg Intravenous Given 09/30/13 0433)  ketorolac (TORADOL) 30 MG/ML injection 30 mg (30 mg Intravenous Given 09/30/13 0559)    New Prescriptions   PROMETHAZINE  (PHENERGAN) 25 MG TABLET    Take 1 tablet (25 mg total) by mouth every 6 (six) hours as needed for nausea or vomiting.   SUMATRIPTAN (IMITREX) 25 MG TABLET    Take 2 tablets (50 mg total) by mouth every 2 (two) hours as needed for migraine (ongoing headache). Maximum daily dose 200mg         Vida Roller, MD 09/30/13 [phone]

## 2013-10-20 ENCOUNTER — Encounter (HOSPITAL_COMMUNITY): Payer: Self-pay | Admitting: Emergency Medicine

## 2013-10-20 ENCOUNTER — Emergency Department (HOSPITAL_COMMUNITY)
Admission: EM | Admit: 2013-10-20 | Discharge: 2013-10-20 | Discharge status: Against Medical Advice | Payer: BC Managed Care – PPO | Attending: Emergency Medicine | Admitting: Emergency Medicine

## 2013-10-20 DIAGNOSIS — F17200 Nicotine dependence, unspecified, uncomplicated: Secondary | ICD-10-CM | POA: Insufficient documentation

## 2013-10-20 DIAGNOSIS — R112 Nausea with vomiting, unspecified: Secondary | ICD-10-CM | POA: Insufficient documentation

## 2013-10-20 DIAGNOSIS — R197 Diarrhea, unspecified: Secondary | ICD-10-CM | POA: Insufficient documentation

## 2013-10-20 DIAGNOSIS — G8929 Other chronic pain: Secondary | ICD-10-CM | POA: Insufficient documentation

## 2013-10-20 DIAGNOSIS — R109 Unspecified abdominal pain: Secondary | ICD-10-CM | POA: Insufficient documentation

## 2013-10-20 NOTE — ED Notes (Signed)
Upper abd pain with n/v/d x 2 days.  Unable to keep fluids down.

## 2013-10-20 NOTE — ED Notes (Signed)
Pt notified registration that she was leaving 

## 2013-10-23 ENCOUNTER — Emergency Department (HOSPITAL_COMMUNITY)
Admission: EM | Admit: 2013-10-23 | Discharge: 2013-10-23 | Discharge status: Against Medical Advice | Payer: BC Managed Care – PPO | Attending: Emergency Medicine | Admitting: Emergency Medicine

## 2013-10-23 ENCOUNTER — Encounter (HOSPITAL_COMMUNITY): Payer: Self-pay | Admitting: Emergency Medicine

## 2013-10-23 DIAGNOSIS — Z9089 Acquired absence of other organs: Secondary | ICD-10-CM | POA: Insufficient documentation

## 2013-10-23 DIAGNOSIS — R109 Unspecified abdominal pain: Secondary | ICD-10-CM

## 2013-10-23 DIAGNOSIS — R112 Nausea with vomiting, unspecified: Secondary | ICD-10-CM | POA: Insufficient documentation

## 2013-10-23 DIAGNOSIS — R1084 Generalized abdominal pain: Secondary | ICD-10-CM | POA: Insufficient documentation

## 2013-10-23 DIAGNOSIS — F17200 Nicotine dependence, unspecified, uncomplicated: Secondary | ICD-10-CM | POA: Insufficient documentation

## 2013-10-23 DIAGNOSIS — R1031 Right lower quadrant pain: Secondary | ICD-10-CM | POA: Insufficient documentation

## 2013-10-23 DIAGNOSIS — R6883 Chills (without fever): Secondary | ICD-10-CM | POA: Insufficient documentation

## 2013-10-23 DIAGNOSIS — R197 Diarrhea, unspecified: Secondary | ICD-10-CM | POA: Insufficient documentation

## 2013-10-23 DIAGNOSIS — Z7982 Long term (current) use of aspirin: Secondary | ICD-10-CM | POA: Insufficient documentation

## 2013-10-23 DIAGNOSIS — I252 Old myocardial infarction: Secondary | ICD-10-CM | POA: Insufficient documentation

## 2013-10-23 MED ORDER — SODIUM CHLORIDE 0.9 % IV BOLUS (SEPSIS): 1000.0000 mL | Freq: Once | INTRAVENOUS | Status: DC

## 2013-10-23 MED ORDER — METOCLOPRAMIDE HCL 5 MG/ML IJ SOLN: 10.0000 mg | Freq: Once | INTRAMUSCULAR | Status: DC

## 2013-10-23 NOTE — ED Provider Notes (Signed)
CSN: [id number]     Arrival date & time 10/23/13  1740 History  This chart was scribed for Hilario Quarryanielle S Ermin Parisien, MD by Quintella ReichertMatthew Underwood, ED scribe.  This patient was seen in room APA07/APA07 and the patient's care was started at 6:29 PM.   Chief Complaint  Patient presents with  . Emesis  . Diarrhea  . Abdominal Pain    Patient is a 33 y.o. female presenting with abdominal pain. The history is provided by the patient and medical records. No language interpreter was used.  Abdominal Pain Pain location:  RLQ Pain severity:  Severe Duration:  1 week Progression:  Worsening Chronicity: per previous medical records, pt has h/o chronic RLQ abdominal pain, but she states her current pain is not her chronic pain. Ineffective treatments:  NSAIDs Associated symptoms: chills, diarrhea, nausea and vomiting   Associated symptoms: no dysuria, no fever and no hematuria     HPI Comments: Shannon KelpStella Sullivan is a 33 y.o. female with h/o chronic RLQ abdominal pain who presents to the Emergency Department complaining of one week of persistent RLQ abdominal pain with associated nausea, vomiting and diarrhea.  Pt states she initially developed abdominal pain one week ago and her other symptoms developed later.  Per previous medical records, pt has h/o chronic RLQ abdominal pain, but she states her current pain is not the same as her chronic pain.  She is able to take sips of fluids but has vomited every time she has tried to eat or drink anything else.  Emesis is described as yellowish.  She has also had 3 episodes per day of diarrhea without blood.  She has attempted to treat symptoms with phenergan and ibuprofen, without relief.  She also complains of chills but denies fever.  She states she wheezes occasionally denies cough or SOB.  She is a current smoker.   Pt medicates regularly with aspirin.  She is allergic to Naproxen.  LNMP was 11/29.  She denies possibility of pregnancy.  She is G6P5.  She is not on birth control  currently but is not sexually active with men.  She admits to h/o cholecystectomy.   Past Medical History  Diagnosis Date  . Acute MI     d/t carbon monoxide poisoning  . Abdominal pain, chronic, right lower quadrant     Past Surgical History  Procedure Laterality Date  . Cholecystectomy      Family History  Problem Relation Age of Onset  . Coronary artery disease Other   . Hypertension Other   . Diabetes Other     History  Substance Use Topics  . Smoking status: Current Some Day Smoker    Types: Cigarettes  . Smokeless tobacco: Not on file  . Alcohol Use: No    OB History   Grav Para Term Preterm Abortions TAB SAB Ect Mult Living                  Review of Systems  Constitutional: Positive for chills. Negative for fever.  Gastrointestinal: Positive for nausea, vomiting, abdominal pain and diarrhea.  Genitourinary: Negative for dysuria and hematuria.  All other systems reviewed and are negative.     Allergies  Naproxen  Home Medications   Current Outpatient Rx  Name  Route  Sig  Dispense  Refill  . aspirin EC 81 MG tablet   Oral   Take 81 mg by mouth daily.         Marland Kitchen. ibuprofen (ADVIL,MOTRIN) 200 MG tablet  Oral   Take 400 mg by mouth every 6 (six) hours as needed. For pain         . promethazine (PHENERGAN) 25 MG tablet   Oral   Take 1 tablet (25 mg total) by mouth every 6 (six) hours as needed for nausea or vomiting.   12 tablet   0    BP 138/94  Pulse 70  Temp(Src) 98.1 F (36.7 C) (Oral)  Resp 18  Ht 5\' 7"  (1.702 m)  Wt 196 lb (88.905 kg)  BMI 30.69 kg/m2  SpO2 100%  LMP 09/20/2013  Physical Exam  Nursing note and vitals reviewed. Constitutional: She is oriented to person, place, and time. She appears well-developed and well-nourished. No distress.  HENT:  Head: Normocephalic and atraumatic.  Eyes: EOM are normal.  Neck: Neck supple. No tracheal deviation present.  Cardiovascular: Normal rate.   Pulmonary/Chest: Effort  normal. No respiratory distress.  Abdominal: There is tenderness (diffusely from mid-line throughout right lower abdomen).  Musculoskeletal: Normal range of motion.  Neurological: She is alert and oriented to person, place, and time.  Skin: Skin is warm and dry.  Psychiatric: She has a normal mood and affect. Her behavior is normal.    ED Course  Procedures (including critical care time)  DIAGNOSTIC STUDIES: Oxygen Saturation is 100% on room air, normal by my interpretation.    COORDINATION OF CARE: 6:37 PM-Discussed treatment plan which includes Reglan injection and labs with pt at bedside and pt agreed to plan.    Labs Review Labs Reviewed - No data to display  Imaging Review No results found.  EKG Interpretation   None       MDM  Ct abdomen done and no acute changes noted.  Patient feels improved.  Patient counseled regarding follow up and return precautions especially return/worsening pain, fever, inability to tolerate food.  I personally performed the services described in this documentation, which was scribed in my presence. The recorded information has been reviewed and considered.       Hilario Quarry, MD 10/28/13 [phone]

## 2013-10-23 NOTE — ED Notes (Signed)
Patient complaining of vomiting, diarrhea, and abdominal pain x 1 week.
